# Patient Record
Sex: Male | Born: 1950 | Race: White | Hispanic: No | Marital: Married | Smoking: Never smoker
Health system: Southern US, Community
[De-identification: ages and names within clinical notes are randomized; demographics above are authoritative.]

## PROBLEM LIST (undated history)

## (undated) DIAGNOSIS — R739 Hyperglycemia, unspecified: Secondary | ICD-10-CM

## (undated) DIAGNOSIS — E785 Hyperlipidemia, unspecified: Secondary | ICD-10-CM

## (undated) DIAGNOSIS — I10 Essential (primary) hypertension: Secondary | ICD-10-CM

## (undated) HISTORY — DX: Essential (primary) hypertension: I10

## (undated) HISTORY — PX: HERNIA REPAIR: SHX51

## (undated) HISTORY — DX: Hyperlipidemia, unspecified: E78.5

## (undated) HISTORY — DX: Hyperglycemia, unspecified: R73.9

---

## 2003-12-25 ENCOUNTER — Ambulatory Visit: Payer: Self-pay | Admitting: Family Medicine

## 2004-01-30 ENCOUNTER — Ambulatory Visit: Payer: Self-pay | Admitting: Family Medicine

## 2004-04-18 ENCOUNTER — Ambulatory Visit: Payer: Self-pay | Admitting: Family Medicine

## 2004-11-25 ENCOUNTER — Ambulatory Visit: Payer: Self-pay | Admitting: Family Medicine

## 2005-03-18 ENCOUNTER — Ambulatory Visit: Payer: Self-pay | Admitting: Family Medicine

## 2005-04-03 ENCOUNTER — Ambulatory Visit: Payer: Self-pay | Admitting: Family Medicine

## 2005-06-02 ENCOUNTER — Ambulatory Visit: Payer: Self-pay | Admitting: Family Medicine

## 2006-02-24 ENCOUNTER — Ambulatory Visit: Payer: Self-pay | Admitting: Family Medicine

## 2006-03-10 ENCOUNTER — Ambulatory Visit: Payer: Self-pay | Admitting: Family Medicine

## 2006-07-04 ENCOUNTER — Encounter: Payer: Self-pay | Admitting: Family Medicine

## 2006-07-06 ENCOUNTER — Ambulatory Visit: Payer: Self-pay | Admitting: Family Medicine

## 2006-07-06 DIAGNOSIS — J309 Allergic rhinitis, unspecified: Secondary | ICD-10-CM | POA: Insufficient documentation

## 2006-07-06 DIAGNOSIS — J45990 Exercise induced bronchospasm: Secondary | ICD-10-CM

## 2006-07-06 DIAGNOSIS — E7800 Pure hypercholesterolemia, unspecified: Secondary | ICD-10-CM

## 2006-07-06 DIAGNOSIS — Z87898 Personal history of other specified conditions: Secondary | ICD-10-CM | POA: Insufficient documentation

## 2006-07-06 DIAGNOSIS — K219 Gastro-esophageal reflux disease without esophagitis: Secondary | ICD-10-CM | POA: Insufficient documentation

## 2006-09-11 ENCOUNTER — Telehealth (INDEPENDENT_AMBULATORY_CARE_PROVIDER_SITE_OTHER): Payer: Self-pay | Admitting: *Deleted

## 2006-09-11 ENCOUNTER — Ambulatory Visit: Payer: Self-pay | Admitting: Family Medicine

## 2006-09-22 DIAGNOSIS — H1030 Unspecified acute conjunctivitis, unspecified eye: Secondary | ICD-10-CM | POA: Insufficient documentation

## 2006-09-22 DIAGNOSIS — S6710XA Crushing injury of unspecified finger(s), initial encounter: Secondary | ICD-10-CM | POA: Insufficient documentation

## 2006-09-23 ENCOUNTER — Ambulatory Visit: Payer: Self-pay | Admitting: Internal Medicine

## 2006-09-23 ENCOUNTER — Encounter (INDEPENDENT_AMBULATORY_CARE_PROVIDER_SITE_OTHER): Payer: Self-pay | Admitting: *Deleted

## 2006-09-24 ENCOUNTER — Telehealth (INDEPENDENT_AMBULATORY_CARE_PROVIDER_SITE_OTHER): Payer: Self-pay | Admitting: *Deleted

## 2007-03-19 ENCOUNTER — Ambulatory Visit: Payer: Self-pay | Admitting: Family Medicine

## 2007-03-19 DIAGNOSIS — J069 Acute upper respiratory infection, unspecified: Secondary | ICD-10-CM | POA: Insufficient documentation

## 2007-03-24 ENCOUNTER — Telehealth: Payer: Self-pay | Admitting: Family Medicine

## 2009-09-11 ENCOUNTER — Encounter (INDEPENDENT_AMBULATORY_CARE_PROVIDER_SITE_OTHER): Payer: Self-pay | Admitting: *Deleted

## 2010-03-05 NOTE — Letter (Signed)
Summary: Nadara Eaton letter  [place] [facility]  [address] [place], [place] [address]   Phone: [phone]  Fax: [phone]       09/11/2009 MRN: [id number]  [facility] [place] [address] [place], [place]  [address]  Dear Mr. Dante Gang,  [facility] [place], and [facility] announce the retirement of Arta Silence, M.D., from full-time practice at the [facility] office effective August 02, 2009 and his plans of returning part-time.  It is important to Dr. Hetty Ely and to our practice that you understand that [facility] has seven physicians in our office for your health care needs.  We will continue to offer the same exceptional care that you have today.    Dr. Hetty Ely has spoken to many of you about his plans for retirement and returning part-time in the fall.   We will continue to work with you through the transition to schedule appointments for you in the office and meet the high standards that [facility] is committed to.   Again, it is with great pleasure that we share the news that Dr. Hetty Ely will return to [facility] at [facility] in October of 2011 with a reduced schedule.    If you have any questions, or would like to request an appointment with one of our physicians, please call us at [phone] and press the option for Scheduling an appointment.  We take pleasure in providing you with excellent patient care and look forward to seeing you at your next office visit.  Our [facility] Physicians are:  Tillman Abide, M.D. Laurita Quint, M.D. Roxy Manns, M.D. Kerby Nora, M.D. Hannah Beat, M.D. Ruthe Mannan, M.D. We proudly welcomed Raechel Ache, M.D. and Eustaquio Boyden, M.D. to the practice in July/August 2011.  Sincerely,  [facility]

## 2018-04-07 ENCOUNTER — Other Ambulatory Visit: Payer: Self-pay

## 2018-04-07 DIAGNOSIS — I10 Essential (primary) hypertension: Secondary | ICD-10-CM

## 2018-04-07 MED ORDER — PROPRANOLOL HCL 20 MG PO TABS
10.0000 mg | ORAL_TABLET | Freq: Every day | ORAL | 3 refills | Status: DC
Start: 2018-04-07 — End: 2018-08-11

## 2018-06-29 ENCOUNTER — Other Ambulatory Visit: Payer: Self-pay | Admitting: Cardiology

## 2018-08-11 ENCOUNTER — Other Ambulatory Visit: Payer: Self-pay | Admitting: Cardiology

## 2018-08-11 NOTE — Telephone Encounter (Signed)
Please fill if necessary

## 2018-09-27 ENCOUNTER — Other Ambulatory Visit: Payer: Self-pay

## 2018-09-27 DIAGNOSIS — E7800 Pure hypercholesterolemia, unspecified: Secondary | ICD-10-CM

## 2018-09-27 MED ORDER — OLMESARTAN MEDOXOMIL 20 MG PO TABS: 20.0000 mg | ORAL_TABLET | Freq: Every day | ORAL | 3 refills | Status: DC

## 2018-09-27 MED ORDER — LIVALO 2 MG PO TABS: 1.0000 | ORAL_TABLET | Freq: Every day | ORAL | 3 refills | Status: DC

## 2018-09-30 ENCOUNTER — Other Ambulatory Visit: Payer: Self-pay

## 2018-12-27 ENCOUNTER — Encounter: Payer: Self-pay | Admitting: Cardiology

## 2018-12-27 ENCOUNTER — Other Ambulatory Visit: Payer: Self-pay

## 2018-12-27 ENCOUNTER — Ambulatory Visit: Payer: 59 | Admitting: Cardiology

## 2018-12-27 VITALS — BP 131/85 | HR 67 | Ht 71.5 in | Wt 190.0 lb

## 2018-12-27 DIAGNOSIS — I10 Essential (primary) hypertension: Secondary | ICD-10-CM | POA: Diagnosis not present

## 2018-12-27 DIAGNOSIS — E7800 Pure hypercholesterolemia, unspecified: Secondary | ICD-10-CM

## 2018-12-27 DIAGNOSIS — R9431 Abnormal electrocardiogram [ECG] [EKG]: Secondary | ICD-10-CM | POA: Diagnosis not present

## 2018-12-27 DIAGNOSIS — R739 Hyperglycemia, unspecified: Secondary | ICD-10-CM

## 2018-12-27 DIAGNOSIS — G250 Essential tremor: Secondary | ICD-10-CM

## 2018-12-27 DIAGNOSIS — E782 Mixed hyperlipidemia: Secondary | ICD-10-CM

## 2018-12-27 MED ORDER — PROPRANOLOL HCL 10 MG PO TABS: 10.0000 mg | ORAL_TABLET | Freq: Every day | ORAL | 2 refills | Status: DC | PRN

## 2018-12-27 MED ORDER — LIVALO 2 MG PO TABS: 1.0000 | ORAL_TABLET | Freq: Every day | ORAL | 3 refills | Status: DC

## 2018-12-27 MED ORDER — PROPRANOLOL HCL 10 MG PO TABS: 10.0000 mg | ORAL_TABLET | ORAL | 2 refills | Status: DC | PRN

## 2018-12-27 MED ORDER — OLMESARTAN MEDOXOMIL 20 MG PO TABS: 20.0000 mg | ORAL_TABLET | Freq: Every day | ORAL | 3 refills | Status: DC

## 2018-12-27 NOTE — Progress Notes (Signed)
Primary Physician/Referring:  Rusty Aus, MD  Patient ID: Jesus Hobbs, male    DOB: 1950/10/29, 68 y.o.   MRN: [id number]  Chief Complaint  Patient presents with  . Hypertension    1 year follow up  . Follow-up   HPI:    Jesus Hobbs  is a 68 y.o. Caucasian male patient with  hypertension and hyperlipidemia. Last stress test was in June 2012 was normal with 12.5 METs. Echocardiogram in 2012 was normal with mild LVH and mild left atrial enlargement.  This is his annual visit. He is presently tolerating Livalo and Zetia without any side effects, previously did not tolerate Crestor or Lipitor. He is essentially asymptomatic.   Past Medical History:  Diagnosis Date  . Hyperglycemia   . Hyperlipidemia   . Hypertension    History reviewed. No pertinent surgical history. Social History   Socioeconomic History  . Marital status: Single    Spouse name: Not on file  . Number of children: Not on file  . Years of education: Not on file  . Highest education level: Not on file  Occupational History  . Not on file  Social Needs  . Financial resource strain: Not on file  . Food insecurity    Worry: Not on file    Inability: Not on file  . Transportation needs    Medical: Not on file    Non-medical: Not on file  Tobacco Use  . Smoking status: Never Smoker  . Smokeless tobacco: Never Used  Substance and Sexual Activity  . Alcohol use: Not on file  . Drug use: Not on file  . Sexual activity: Not on file  Lifestyle  . Physical activity    Days per week: Not on file    Minutes per session: Not on file  . Stress: Not on file  Relationships  . Social Herbalist on phone: Not on file    Gets together: Not on file    Attends religious service: Not on file    Active member of club or organization: Not on file    Attends meetings of clubs or organizations: Not on file    Relationship status: Not on file  . Intimate partner violence    Fear of current or  ex partner: Not on file    Emotionally abused: Not on file    Physically abused: Not on file    Forced sexual activity: Not on file  Other Topics Concern  . Not on file  Social History Narrative  . Not on file   ROS  Review of Systems  Constitution: Negative for chills, decreased appetite, malaise/fatigue and weight gain.  Cardiovascular: Negative for dyspnea on exertion, leg swelling and syncope.  Endocrine: Negative for cold intolerance.  Hematologic/Lymphatic: Does not bruise/bleed easily.  Musculoskeletal: Negative for joint swelling.  Gastrointestinal: Negative for abdominal pain, anorexia, change in bowel habit, hematochezia and melena.  Neurological: Negative for headaches and light-headedness.  Psychiatric/Behavioral: Negative for depression and substance abuse.  All other systems reviewed and are negative.  Objective   Vitals with BMI 12/27/2018 03/19/2007 09/23/2006  Height 5' 11.5" - -  Weight 190 lbs 200 lbs 195 lbs  BMI 18.84 - -  Systolic 166 063 016  Diastolic 85 96 87  Pulse 67 64 68    Physical Exam  Constitutional: He appears well-developed and well-nourished.  HENT:  Head: Atraumatic.  Eyes: Conjunctivae are normal.  Neck: Neck supple. No JVD present.  No thyromegaly present.  Cardiovascular: Normal rate, regular rhythm, normal heart sounds and intact distal pulses. Exam reveals no gallop.  No murmur heard. No leg edema, no JVD.  Pulmonary/Chest: Effort normal and breath sounds normal.  Abdominal: Soft. Bowel sounds are normal.  Musculoskeletal: Normal range of motion.  Neurological: He is alert.  Skin: Skin is warm and dry.  Psychiatric: He has a normal mood and affect.   Laboratory examination:   Labs 09/21/2017: HB 15.9/HCT 49.0, normal indicis, serum glucose 127 mg, BUN 19, creatinine 1.1, eGFR greater than 60 well.  A1c 6.4%.  Total cholesterol 167, triglycerides 160, HDL 44, LDL 91.  TSH normal.  Complete Blood Count (CBC) with  DifferentialResulted: 09/28/2018 9:40 AM [place] Component Name Value Ref Range  White Blood Cell Count - [facility] 4.5 3.4 - 10.8 x10E3/uL  Red Blood Cell Count - [facility] 4.89 4.14 - 5.8 x10E6/uL  Hemoglobin - [facility] 15.4 13 - 17.7 g/dL  Hematocrit - [facility] 44.8 37.5 - 51 %  MCV - [facility] 92 79 - 97 fL  MCH- [facility] 31.5 26.6 - 33 pg  MCHC - [facility] 34.4 31.5 - 35.7 g/dL  RDW - [facility] 12.2 11.6 - 15.4 %  Platelet Count - [facility] 175 150 - 450 x10E3/uL  Neutrophils - [facility] 69 Not Estab. %  LYMPHS -[facility] 17 Not Estab. %  Monocytes - [facility] 11 Not Estab. %  Eos - [facility] 3 Not Estab. %  Basos - [facility] 0 Not Estab. %  Neutrophils (Absolute) - [facility] 3.0 1.4 - 7 x10E3/uL  Lymphs (Absolute) - [facility] 0.8 0.7 - 3.1 x10E3/uL  Monocytes(Absolute) - [facility] 0.5 0.1 - 0.9 x10E3/uL  Eos (Absolute) - [facility] 0.1 0 - 0.4 x10E3/uL  Baso (Absolute) - [facility] 0.0 0 - 0.2 x10E3/uL  Immature Granulocytes - [facility] 0 Not Estab. %  Immature Grans (Abs) - [facility]     Comprehensive Metabolic Panel (CMP)Resulted: 09/28/2018 9:40 AM [place] Component Name Value Ref Range  Glucose Random - [facility] 133 (H) 65 - 99 mg/dL  Blood Urea Nitrogen - [facility] 16 8 - 27 mg/dL  Creatinine- [facility] 0.99 0.76 - 1.27 mg/dL  eGFR If NonAfricn Am - [facility] 77 >59 mL/min/1.73  eGFR If Africn Am - [facility] 89 >59 mL/min/1.73  Bun/Creatinine Ratio - [facility] 16 10 - 24   Sodium - [facility] 140 134 - 144 mmol/L  Potassium - [facility] 4.3 3.5 - 5.2 mmol/L  Chloride - [facility] 101 96 - 106 mmol/L  Carbon Dioxide - [facility] 24 20 - 29 mmol/L  Calcium - [facility] 9.7 8.6 - 10.2 mg/dL  Protein Total - [facility] 6.8 6 - 8.5 g/dL  Albumin - [facility] 4.5 3.8 - 4.8 g/dL  Globulin, Total - [facility] 2.3 1.5 - 4.5 g/dL  A/G Ratio - [facility] 2.0 1.2 - 2.2   Bilirubin Total - [facility] 0.8 0 - 1.2 mg/dL  Alkaline Phosphatase - [facility] 53 39 - 117 IU/L  Ast - [facility] 17 0 - 40 IU/L  Alanine  Aminotransferase - [facility] 20 0 - 44 IU/L   Lipid Panel With LDL/HDL Ratio - [facility]Resulted: 09/28/2018 9:40 AM [place] Component Name Value Ref Range  Cholesterol, Total - [facility] 171 100 - 199 mg/dL  Triglycerides - [facility] 145 0 - 149 mg/dL  Hdl Cholesterol - [facility] 48 >39 mg/dL  VLDL Cholesterol Cal - [facility] 29 5 - 40 mg/dL  Low Density Lipoprotein - [facility] 94  Comment:    Hemoglobin  A1CResulted: 09/21/2017 1:14 PM [place] Component Name Value Ref Range  Hemoglobin A1C 6.4 (H) 4.2 - 5.6 %  Average Blood Glucose (Calc) 137 mg/dL   No results for input(s): NA, K, CL, CO2, GLUCOSE, BUN, CREATININE, CALCIUM, GFRNONAA, GFRAA in the last 8760 hours. CrCl cannot be calculated (No successful lab value found.).  No flowsheet data found. No flowsheet data found. Lipid Panel  No results found for: CHOL, TRIG, HDL, CHOLHDL, VLDL, LDLCALC, LDLDIRECT HEMOGLOBIN A1C No results found for: HGBA1C, MPG TSH No results for input(s): TSH in the last 8760 hours. Medications and allergies   Allergies  Allergen Reactions  . Loratadine-Pseudoephedrine Er     REACTION: unspecified  . Mometasone Furoate     REACTION: unspecified  . Prednisone     REACTION: unspecified     Current Outpatient Medications  Medication Instructions  . aspirin 325 mg, Oral, Daily  . Livalo 2 mg, Oral, Daily  . olmesartan (BENICAR) 20 mg, Oral, Daily  . propranolol (INDERAL) 10 mg, Oral, As needed    Radiology:  No results found. Cardiac Studies:   Carotid duplex 11/04/10: Normal carotid duplex. Normal intimal medial thickness  Echo 11/04/10: Normal LVEF. Mild LVH. Mild left atrial enlargement.   Stress EKG 07/08/10 Normal Stress EKG. 12.5 METS.    Assessment     ICD-10-CM   1. Essential hypertension  I10 EKG 12-Lead    olmesartan (BENICAR) 20 MG tablet  2. Mixed hyperlipidemia  E78.2 EKG 12-Lead    Pitavastatin Calcium (LIVALO) 2 MG TABS  3.  Hyperglycemia  R73.9   4. Nonspecific abnormal electrocardiogram (ECG) (EKG)  R94.31   5. HYPERCHOLESTEROLEMIA, MIXED  E78.00 olmesartan (BENICAR) 20 MG tablet  6. Benign essential tremor  G25.0 propranolol (INDERAL) 10 MG tablet    EKG 12/27/2018: Normal sinus rhythm at rate of 66 bpm, normal axis.  Nonspecific T normality, cannot exclude lateral ischemia. No significant change from  EKG 12/28/2017.  Recommendations:   Meds ordered this encounter  Medications  . olmesartan (BENICAR) 20 MG tablet    Sig: Take 1 tablet (20 mg total) by mouth daily.    Dispense:  90 tablet    Refill:  3  . propranolol (INDERAL) 10 MG tablet    Sig: Take 1 tablet (10 mg total) by mouth as needed.    Dispense:  60 tablet    Refill:  2  . Pitavastatin Calcium (LIVALO) 2 MG TABS    Sig: Take 1 tablet (2 mg total) by mouth daily.    Dispense:  90 tablet    Refill:  3    Patient is here on annual visit and follow-up of abnormal EKG, hypertension, hyperlipidemia.  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  I reviewed his labs, he does have hyperglycemia, is aware of this.  I refilled his prescriptions.  He also has essential tremors.  Adrian Prows, MD, [facility] 12/27/2018, 9:31 AM [facility] [address] Pager: [phone] Office: [phone] If no answer Cell [phone]

## 2019-12-26 ENCOUNTER — Encounter: Payer: Self-pay | Admitting: Cardiology

## 2019-12-26 ENCOUNTER — Other Ambulatory Visit: Payer: Self-pay

## 2019-12-26 ENCOUNTER — Ambulatory Visit: Payer: 59 | Admitting: Cardiology

## 2019-12-26 VITALS — BP 112/80 | HR 58 | Resp 16 | Ht 71.0 in | Wt 194.0 lb

## 2019-12-26 DIAGNOSIS — R739 Hyperglycemia, unspecified: Secondary | ICD-10-CM

## 2019-12-26 DIAGNOSIS — I10 Essential (primary) hypertension: Secondary | ICD-10-CM

## 2019-12-26 DIAGNOSIS — E782 Mixed hyperlipidemia: Secondary | ICD-10-CM

## 2019-12-26 MED ORDER — OMEGA-3-ACID ETHYL ESTERS 1 G PO CAPS: 2.0000 g | ORAL_CAPSULE | Freq: Two times a day (BID) | ORAL | 3 refills | Status: DC

## 2019-12-26 NOTE — Progress Notes (Signed)
Primary Physician/Referring:  Rusty Aus, MD  Patient ID: Jesus Hobbs, male    DOB: [birth year], 69 y.o.   MRN: [id number]  Chief Complaint  Patient presents with  . Hypertension  . Abnormal ECG  . Follow-up    1 year   HPI:    Jesus Hobbs  is a 69 y.o. Caucasian male patient with  hypertension and hyperlipidemia. Last stress test was in June 2012 was normal with 12.5 METs. Echocardiogram in 2012 was normal with mild LVH and mild left atrial enlargement.  This is his annual visit. He is presently tolerating Livalo without any side effects, previously did not tolerate Crestor or Lipitor. He is essentially asymptomatic.   Past Medical History:  Diagnosis Date  . Hyperglycemia   . Hyperlipidemia   . Hypertension    History reviewed. No pertinent surgical history.   Social History   Tobacco Use  . Smoking status: Never Smoker  . Smokeless tobacco: Never Used  Substance Use Topics  . Alcohol use: Never  Marital Status: Married    ROS  Review of Systems  Cardiovascular: Negative for chest pain, dyspnea on exertion and leg swelling.  Gastrointestinal: Negative for melena.  Neurological: Positive for tremors.   Objective   Vitals with BMI 12/26/2019 12/27/2018 03/19/2007  Height 5' 11" 5' 11.5" -  Weight 194 lbs 190 lbs 200 lbs  BMI 07.68 08.81 -  Systolic 103 159 458  Diastolic 80 85 96  Pulse 58 67 64    Physical Exam Constitutional:      Appearance: He is well-developed.  HENT:     Head: Atraumatic.  Eyes:     Conjunctiva/sclera: Conjunctivae normal.  Neck:     Thyroid: No thyromegaly.     Vascular: No JVD.  Cardiovascular:     Rate and Rhythm: Normal rate and regular rhythm.     Pulses: Intact distal pulses.     Heart sounds: Normal heart sounds. No murmur heard.  No gallop.      Comments: No leg edema, no JVD. Pulmonary:     Effort: Pulmonary effort is normal.     Breath sounds: Normal breath sounds.  Abdominal:     General: Bowel  sounds are normal.     Palpations: Abdomen is soft.  Musculoskeletal:        General: Normal range of motion.     Cervical back: Neck supple.  Skin:    General: Skin is warm and dry.  Neurological:     Mental Status: He is alert.    Laboratory examination:   Labs 10/04/2019:  Hb 15.1/HCT 44.7, platelets 186, normal indicis.  Serum glucose 126, BUN 10, creatinine 0.95, EGFR >60 mL.  Total cholesterol 164, triglyceride 200, HDL 43, LDL 87.  A1c 6.6%.  TSH normal.  Vitamin D normal at 36.9.  Labs 09/21/2017: HB 15.9/HCT 49.0, normal indicis, serum glucose 127 mg, BUN 19, creatinine 1.1, eGFR greater than 60 well.  A1c 6.4%.  Total cholesterol 167, triglycerides 160, HDL 44, LDL 91.  TSH normal.  Medications and allergies   Allergies  Allergen Reactions  . Loratadine-Pseudoephedrine Er     REACTION: unspecified  . Mometasone Furoate     REACTION: unspecified  . Prednisone     REACTION: unspecified    Current Outpatient Medications on File Prior to Visit  Medication Sig Dispense Refill  . Cholecalciferol 25 MCG (1000 UT) capsule Take by mouth.    . cyanocobalamin 1000 MCG tablet Take by  mouth.    . olmesartan (BENICAR) 20 MG tablet Take 1 tablet (20 mg total) by mouth daily. 90 tablet 3  . Pitavastatin Calcium (LIVALO) 2 MG TABS Take 1 tablet (2 mg total) by mouth daily. 90 tablet 3  . propranolol (INDERAL) 10 MG tablet Take 1 tablet (10 mg total) by mouth daily as needed. 60 tablet 2   No current facility-administered medications on file prior to visit.    Radiology:  No results found. Cardiac Studies:   Carotid duplex 11/04/10: Normal carotid duplex. Normal intimal medial thickness  Echo 11/04/10: Normal LVEF. Mild LVH. Mild left atrial enlargement.   Stress EKG 07/08/10 Normal Stress EKG. 12.5 METS.   EKG:  EKG 12/26/2019: Sinus bradycardia at rate of 53 bpm, normal axis, nonspecific T abnormality.  No significant change from 12/27/2018.  Assessment      ICD-10-CM   1. Essential hypertension  I10 EKG 12-Lead  2. Mixed hyperlipidemia  E78.2 omega-3 acid ethyl esters (LOVAZA) 1 g capsule  3. Hyperglycemia  R73.9     Meds ordered this encounter  Medications  . omega-3 acid ethyl esters (LOVAZA) 1 g capsule    Sig: Take 2 capsules (2 g total) by mouth 2 (two) times daily.    Dispense:  360 capsule    Refill:  3   Medications Discontinued During This Encounter  Medication Reason  . aspirin 325 MG EC tablet Discontinued by provider  . Omega-3 Fatty Acids (FISH OIL) 1000 MG CAPS Change in therapy     Recommendations:    Jesus Hobbs  is a 69 y.o. Caucasian male patient with benign essential tremors and takes propranolol on a as needed basis, hypertension and hyperlipidemia. Last stress test was in June 2012 was normal with 12.5 METs. Echocardiogram in 2012 was normal with mild LVH and mild left atrial enlargement.  Patient is here on annual visit and follow-up of abnormal EKG, hypertension, hyperlipidemia.  He is presently doing well and remains asymptomatic. I reviewed his labs, he does have hyperglycemia and in diabetic range.  His triglycerides are also uncontrolled.  I have added Lovaza 2 g twice daily and discontinue OTC fish oil supplement.  He will continue to follow-up with his PCP.  Blood pressure is well controlled.  Examination is unchanged and he remains asymptomatic.  I will see him back in 1 year for follow-up.   Adrian Prows, MD, [facility] 12/26/2019, 9:46 AM Office: [phone] Pager: [phone]

## 2020-01-15 ENCOUNTER — Other Ambulatory Visit: Payer: Self-pay | Admitting: Cardiology

## 2020-01-15 DIAGNOSIS — E782 Mixed hyperlipidemia: Secondary | ICD-10-CM

## 2020-11-21 ENCOUNTER — Other Ambulatory Visit: Payer: Self-pay | Admitting: Orthopedic Surgery

## 2020-11-29 ENCOUNTER — Encounter
Admission: RE | Admit: 2020-11-29 | Discharge: 2020-11-29 | Disposition: A | Discharge status: Home / Self Care | Payer: 59 | Source: Ambulatory Visit | Attending: Orthopedic Surgery | Admitting: Orthopedic Surgery

## 2020-11-29 ENCOUNTER — Other Ambulatory Visit: Payer: Self-pay

## 2020-11-29 VITALS — BP 146/79 | HR 80 | Temp 98.6°F | Resp 18 | Ht 72.0 in | Wt 187.3 lb

## 2020-11-29 DIAGNOSIS — Z01818 Encounter for other preprocedural examination: Secondary | ICD-10-CM | POA: Insufficient documentation

## 2020-11-29 DIAGNOSIS — Z01812 Encounter for preprocedural laboratory examination: Secondary | ICD-10-CM

## 2020-11-29 DIAGNOSIS — Z01810 Encounter for preprocedural cardiovascular examination: Secondary | ICD-10-CM | POA: Diagnosis not present

## 2020-11-29 LAB — CBC WITH DIFFERENTIAL/PLATELET
Abs Immature Granulocytes: 0.02 10*3/uL (ref 0.00–0.07)
Basophils Absolute: 0 10*3/uL (ref 0.0–0.1)
Basophils Relative: 1 %
Eosinophils Absolute: 0.1 10*3/uL (ref 0.0–0.5)
Eosinophils Relative: 2 %
HCT: 42.1 % (ref 39.0–52.0)
Hemoglobin: 14 g/dL (ref 13.0–17.0)
Immature Granulocytes: 1 %
Lymphocytes Relative: 23 %
Lymphs Abs: 0.9 10*3/uL (ref 0.7–4.0)
MCH: 30.8 pg (ref 26.0–34.0)
MCHC: 33.3 g/dL (ref 30.0–36.0)
MCV: 92.7 fL (ref 80.0–100.0)
Monocytes Absolute: 0.4 10*3/uL (ref 0.1–1.0)
Monocytes Relative: 9 %
Neutro Abs: 2.6 10*3/uL (ref 1.7–7.7)
Neutrophils Relative %: 64 %
Platelets: 167 10*3/uL (ref 150–400)
RBC: 4.54 MIL/uL (ref 4.22–5.81)
RDW: 11.9 % (ref 11.5–15.5)
WBC: 4 10*3/uL (ref 4.0–10.5)
nRBC: 0 % (ref 0.0–0.2)

## 2020-11-29 LAB — URINALYSIS, ROUTINE W REFLEX MICROSCOPIC
Bilirubin Urine: NEGATIVE
Glucose, UA: NEGATIVE mg/dL
Hgb urine dipstick: NEGATIVE
Ketones, ur: NEGATIVE mg/dL
Leukocytes,Ua: NEGATIVE
Nitrite: NEGATIVE
Protein, ur: NEGATIVE mg/dL
Specific Gravity, Urine: 1.006 (ref 1.005–1.030)
pH: 5 (ref 5.0–8.0)

## 2020-11-29 LAB — COMPREHENSIVE METABOLIC PANEL
ALT: 19 U/L (ref 0–44)
AST: 19 U/L (ref 15–41)
Albumin: 4.1 g/dL (ref 3.5–5.0)
Alkaline Phosphatase: 48 U/L (ref 38–126)
Anion gap: 8 (ref 5–15)
BUN: 16 mg/dL (ref 8–23)
CO2: 28 mmol/L (ref 22–32)
Calcium: 9.1 mg/dL (ref 8.9–10.3)
Chloride: 102 mmol/L (ref 98–111)
Creatinine, Ser: 0.96 mg/dL (ref 0.61–1.24)
GFR, Estimated: >60 mL/min (ref >60)
Glucose, Bld: 143 mg/dL — ABNORMAL HIGH (ref 70–99)
Potassium: 3.9 mmol/L (ref 3.5–5.1)
Sodium: 138 mmol/L (ref 135–145)
Total Bilirubin: 1 mg/dL (ref 0.3–1.2)
Total Protein: 6.8 g/dL (ref 6.5–8.1)

## 2020-11-29 LAB — TYPE AND SCREEN
ABO/RH(D): O POS
Antibody Screen: NEGATIVE

## 2020-11-29 LAB — SURGICAL PCR SCREEN
MRSA, PCR: NEGATIVE
Staphylococcus aureus: NEGATIVE

## 2020-11-29 NOTE — Patient Instructions (Addendum)
Your procedure is scheduled on: Thursday 12/13/20 Report to the Registration Desk on the 1st floor of the Medical Mall. To find out your arrival time, please call [phone] between 1PM - 3PM on: Wednesday 12/12/20  REMEMBER: Instructions that are not followed completely may result in serious medical risk, up to and including death; or upon the discretion of your surgeon and anesthesiologist your surgery may need to be rescheduled.  Do not eat food after midnight the night before surgery.  No gum chewing, lozengers or hard candies.  You may however, drink CLEAR liquids up to 2 hours before you are scheduled to arrive for your surgery. Do not drink anything within 2 hours of your scheduled arrival time.  Clear liquids include: - water  - apple juice without pulp - gatorade (not RED, PURPLE, OR BLUE) - black coffee or tea (Do NOT add milk or creamers to the coffee or tea) Do NOT drink anything that is not on this list.  In addition, your doctor has ordered for you to drink the provided  Ensure Pre-Surgery Clear Carbohydrate Drink  Drinking this carbohydrate drink up to two hours before surgery helps to reduce insulin resistance and improve patient outcomes. Please complete drinking 2 hours prior to scheduled arrival time.  TAKE THESE MEDICATIONS THE MORNING OF SURGERY WITH A SIP OF WATER: propranolol (INDERAL) 10 MG tablet traMADol (ULTRAM) 50 MG tablet  One week prior to surgery: Stop Anti-inflammatories (NSAIDS) such as Advil, Aleve, Ibuprofen, Motrin, Naproxen, Naprosyn and Aspirin based products such as Excedrin, Goodys Powder, BC Powder. Stop Cholecalciferol 25 MCG (1000 UT) capsule, Cyanocobalamin 1500 MCG TBDP, Misc Natural Products (APPLE CIDER VINEGAR DIET PO), zinc gluconate 50 MG tablet and ANY OVER THE COUNTER supplements until after surgery. You may however, continue to take Tylenol if needed for pain up until the day of surgery.  No Alcohol for 24 hours before or after  surgery.  No Smoking including e-cigarettes for 24 hours prior to surgery.  No chewable tobacco products for at least 6 hours prior to surgery.  No nicotine patches on the day of surgery.  Do not use any "recreational" drugs for at least a week prior to your surgery.  Please be advised that the combination of cocaine and anesthesia may have negative outcomes, up to and including death. If you test positive for cocaine, your surgery will be cancelled.  On the morning of surgery brush your teeth with toothpaste and water, you may rinse your mouth with mouthwash if you wish. Do not swallow any toothpaste or mouthwash.  Use CHG Soap as directed on instruction sheet.  Do not wear jewelry.  Do not wear lotions, powders, or colognes.   Do not shave body from the neck down 48 hours prior to surgery just in case you cut yourself which could leave a site for infection.  Also, freshly shaved skin may become irritated if using the CHG soap.  Do not bring valuables to the hospital. [facility] is not responsible for any missing/lost belongings or valuables.   Notify your doctor if there is any change in your medical condition (cold, fever, infection).  Wear comfortable clothing (specific to your surgery type) to the hospital.  After surgery, you can help prevent lung complications by doing breathing exercises.  Take deep breaths and cough every 1-2 hours. Your doctor may order a device called an Incentive Spirometer to help you take deep breaths.  If you are being admitted to the hospital overnight, leave your  suitcase in the car. After surgery it may be brought to your room.  If you are taking public transportation, you will need to have a responsible adult (18 years or older) with you. Please confirm with your physician that it is acceptable to use public transportation.   Please call the Pre-admissions Testing Dept. at [phone] if you have any questions about these  instructions.  Surgery Visitation Policy:  Patients undergoing a surgery or procedure may have one family member or support person with them as long as that person is not COVID-19 positive or experiencing its symptoms.  That person may remain in the waiting area during the procedure and may rotate out with other people.  Inpatient Visitation:    Visiting hours are 7 a.m. to 8 p.m. Up to two visitors ages 16+ are allowed at one time in a patient room. The visitors may rotate out with other people during the day. Visitors must check out when they leave, or other visitors will not be allowed. One designated support person may remain overnight. The visitor must pass COVID-19 screenings, use hand sanitizer when entering and exiting the patient's room and wear a mask at all times, including in the patient's room. Patients must also wear a mask when staff or their visitor are in the room. Masking is required regardless of vaccination status.

## 2020-11-30 ENCOUNTER — Other Ambulatory Visit: Payer: Self-pay

## 2020-12-11 ENCOUNTER — Other Ambulatory Visit: Payer: Self-pay

## 2020-12-11 ENCOUNTER — Other Ambulatory Visit
Admission: RE | Admit: 2020-12-11 | Discharge: 2020-12-11 | Disposition: A | Discharge status: Home / Self Care | Payer: 59 | Source: Ambulatory Visit | Attending: Orthopedic Surgery | Admitting: Orthopedic Surgery

## 2020-12-11 DIAGNOSIS — Z01812 Encounter for preprocedural laboratory examination: Secondary | ICD-10-CM | POA: Diagnosis not present

## 2020-12-11 DIAGNOSIS — Z20822 Contact with and (suspected) exposure to covid-19: Secondary | ICD-10-CM | POA: Diagnosis not present

## 2020-12-12 LAB — SARS CORONAVIRUS 2 (TAT 6-24 HRS): SARS Coronavirus 2: NEGATIVE

## 2020-12-13 ENCOUNTER — Ambulatory Visit: Payer: 59 | Admitting: Urgent Care

## 2020-12-13 ENCOUNTER — Ambulatory Visit: Payer: 59

## 2020-12-13 ENCOUNTER — Encounter
Admission: RE | Disposition: A | Discharge status: Home / Self Care | Payer: Self-pay | Source: Home / Self Care | Attending: Orthopedic Surgery

## 2020-12-13 ENCOUNTER — Ambulatory Visit
Admission: RE | Admit: 2020-12-13 | Discharge: 2020-12-13 | Disposition: A | Discharge status: Home / Self Care | Payer: 59 | Attending: Orthopedic Surgery | Admitting: Orthopedic Surgery

## 2020-12-13 ENCOUNTER — Ambulatory Visit: Payer: 59 | Admitting: Certified Registered"

## 2020-12-13 ENCOUNTER — Other Ambulatory Visit: Payer: Self-pay

## 2020-12-13 ENCOUNTER — Encounter: Payer: Self-pay | Admitting: Orthopedic Surgery

## 2020-12-13 DIAGNOSIS — Z419 Encounter for procedure for purposes other than remedying health state, unspecified: Secondary | ICD-10-CM

## 2020-12-13 DIAGNOSIS — Z01812 Encounter for preprocedural laboratory examination: Secondary | ICD-10-CM

## 2020-12-13 DIAGNOSIS — G8918 Other acute postprocedural pain: Secondary | ICD-10-CM

## 2020-12-13 DIAGNOSIS — M1612 Unilateral primary osteoarthritis, left hip: Secondary | ICD-10-CM | POA: Diagnosis present

## 2020-12-13 DIAGNOSIS — M25572 Pain in left ankle and joints of left foot: Secondary | ICD-10-CM

## 2020-12-13 HISTORY — PX: TOTAL HIP ARTHROPLASTY: SHX124

## 2020-12-13 LAB — ABO/RH: ABO/RH(D): O POS

## 2020-12-13 SURGERY — ARTHROPLASTY, HIP, TOTAL, ANTERIOR APPROACH
Anesthesia: General | Site: Hip | Laterality: Left

## 2020-12-13 MED ORDER — METHOCARBAMOL 500 MG PO TABS
ORAL_TABLET | ORAL | Status: AC
  Administered 2020-12-13: 500 mg via ORAL
  Filled 2020-12-13: qty 1

## 2020-12-13 MED ORDER — TRAMADOL HCL 50 MG PO TABS
ORAL_TABLET | ORAL | Status: AC
  Filled 2020-12-13: qty 1

## 2020-12-13 MED ORDER — NEOMYCIN-POLYMYXIN B GU 40-200000 IR SOLN
Status: DC | PRN
  Administered 2020-12-13: 4 mL

## 2020-12-13 MED ORDER — OXYCODONE HCL 5 MG PO TABS
ORAL_TABLET | ORAL | Status: AC
  Administered 2020-12-13: 5 mg via ORAL
  Filled 2020-12-13: qty 1

## 2020-12-13 MED ORDER — MIDAZOLAM HCL 5 MG/5ML IJ SOLN
INTRAMUSCULAR | Status: DC | PRN
  Administered 2020-12-13: 2 mg via INTRAVENOUS

## 2020-12-13 MED ORDER — MENTHOL 3 MG MT LOZG
1.0000 | LOZENGE | OROMUCOSAL | Status: DC | PRN
  Filled 2020-12-13: qty 9

## 2020-12-13 MED ORDER — CEFAZOLIN SODIUM-DEXTROSE 2-4 GM/100ML-% IV SOLN: 2.0000 g | Freq: Three times a day (TID) | INTRAVENOUS | Status: DC

## 2020-12-13 MED ORDER — PHENOL 1.4 % MT LIQD
1.0000 | OROMUCOSAL | Status: DC | PRN
  Filled 2020-12-13: qty 177

## 2020-12-13 MED ORDER — LACTATED RINGERS IV SOLN: INTRAVENOUS | Status: DC

## 2020-12-13 MED ORDER — SODIUM CHLORIDE 0.9 % IV SOLN: INTRAVENOUS | Status: DC

## 2020-12-13 MED ORDER — HYDROCODONE-ACETAMINOPHEN 5-325 MG PO TABS: 1.0000 | ORAL_TABLET | ORAL | Status: DC | PRN

## 2020-12-13 MED ORDER — TRAMADOL HCL 50 MG PO TABS
50.0000 mg | ORAL_TABLET | Freq: Four times a day (QID) | ORAL | Status: DC
  Administered 2020-12-13: 50 mg via ORAL

## 2020-12-13 MED ORDER — HYDROCODONE-ACETAMINOPHEN 7.5-325 MG PO TABS
1.0000 | ORAL_TABLET | ORAL | Status: DC | PRN
  Filled 2020-12-13: qty 2

## 2020-12-13 MED ORDER — HYDROMORPHONE HCL 1 MG/ML IJ SOLN
INTRAMUSCULAR | Status: AC
  Administered 2020-12-13: 0.5 mg via INTRAVENOUS
  Filled 2020-12-13: qty 1

## 2020-12-13 MED ORDER — BUPIVACAINE HCL (PF) 0.25 % IJ SOLN
INTRAMUSCULAR | Status: AC
  Filled 2020-12-13: qty 30

## 2020-12-13 MED ORDER — ACETAMINOPHEN 10 MG/ML IV SOLN
INTRAVENOUS | Status: AC
  Filled 2020-12-13: qty 100

## 2020-12-13 MED ORDER — LACTATED RINGERS IV BOLUS
500.0000 mL | Freq: Once | INTRAVENOUS | Status: AC
  Administered 2020-12-13: 500 mL via INTRAVENOUS

## 2020-12-13 MED ORDER — ROCURONIUM BROMIDE 10 MG/ML (PF) SYRINGE
PREFILLED_SYRINGE | INTRAVENOUS | Status: AC
  Filled 2020-12-13: qty 10

## 2020-12-13 MED ORDER — ENOXAPARIN SODIUM 40 MG/0.4ML IJ SOSY: 40.0000 mg | PREFILLED_SYRINGE | INTRAMUSCULAR | Status: DC

## 2020-12-13 MED ORDER — LABETALOL HCL 5 MG/ML IV SOLN
5.0000 mg | INTRAVENOUS | Status: DC | PRN
  Administered 2020-12-13: 5 mg via INTRAVENOUS

## 2020-12-13 MED ORDER — HYDROMORPHONE HCL 1 MG/ML IJ SOLN
INTRAMUSCULAR | Status: DC | PRN
  Administered 2020-12-13 (×2): .5 mg via INTRAVENOUS

## 2020-12-13 MED ORDER — METOCLOPRAMIDE HCL 5 MG/ML IJ SOLN: 5.0000 mg | Freq: Three times a day (TID) | INTRAMUSCULAR | Status: DC | PRN

## 2020-12-13 MED ORDER — LIDOCAINE HCL (CARDIAC) PF 100 MG/5ML IV SOSY
PREFILLED_SYRINGE | INTRAVENOUS | Status: DC | PRN
  Administered 2020-12-13: 60 mg via INTRAVENOUS

## 2020-12-13 MED ORDER — CHOLECALCIFEROL 25 MCG (1000 UT) PO CAPS: 4000.0000 [IU] | ORAL_CAPSULE | Freq: Every day | ORAL | Status: DC

## 2020-12-13 MED ORDER — DEXAMETHASONE SODIUM PHOSPHATE 10 MG/ML IJ SOLN
INTRAMUSCULAR | Status: AC
  Filled 2020-12-13: qty 1

## 2020-12-13 MED ORDER — PROPOFOL 10 MG/ML IV BOLUS
INTRAVENOUS | Status: DC | PRN
  Administered 2020-12-13: 120 mg via INTRAVENOUS

## 2020-12-13 MED ORDER — KETAMINE HCL 50 MG/5ML IJ SOSY
PREFILLED_SYRINGE | INTRAMUSCULAR | Status: AC
  Filled 2020-12-13: qty 5

## 2020-12-13 MED ORDER — PRONTOSAN WOUND IRRIGATION OPTIME
TOPICAL | Status: DC | PRN
  Administered 2020-12-13: 1

## 2020-12-13 MED ORDER — CHLORHEXIDINE GLUCONATE 0.12 % MT SOLN
OROMUCOSAL | Status: AC
  Administered 2020-12-13: 15 mL via OROMUCOSAL
  Filled 2020-12-13: qty 15

## 2020-12-13 MED ORDER — PROPRANOLOL HCL 10 MG PO TABS: 10.0000 mg | ORAL_TABLET | Freq: Every day | ORAL | Status: DC | PRN

## 2020-12-13 MED ORDER — ENOXAPARIN SODIUM 40 MG/0.4ML IJ SOSY: 40.0000 mg | PREFILLED_SYRINGE | INTRAMUSCULAR | 0 refills | Status: DC

## 2020-12-13 MED ORDER — POLYETHYLENE GLYCOL 3350 17 G PO PACK
17.0000 g | PACK | Freq: Every day | ORAL | 0 refills | Status: DC
Start: 2020-12-13 — End: 2021-09-03

## 2020-12-13 MED ORDER — PHENYLEPHRINE HCL (PRESSORS) 10 MG/ML IV SOLN
INTRAVENOUS | Status: DC | PRN
  Administered 2020-12-13: 100 ug via INTRAVENOUS

## 2020-12-13 MED ORDER — PRAVASTATIN SODIUM 40 MG PO TABS
40.0000 mg | ORAL_TABLET | Freq: Every day | ORAL | Status: DC
Start: 2020-12-13 — End: 2020-12-13

## 2020-12-13 MED ORDER — ONDANSETRON HCL 4 MG/2ML IJ SOLN: 4.0000 mg | Freq: Four times a day (QID) | INTRAMUSCULAR | Status: DC | PRN

## 2020-12-13 MED ORDER — HYDROMORPHONE HCL 1 MG/ML IJ SOLN
0.5000 mg | INTRAMUSCULAR | Status: DC | PRN
  Administered 2020-12-13: 0.5 mg via INTRAVENOUS

## 2020-12-13 MED ORDER — 0.9 % SODIUM CHLORIDE (POUR BTL) OPTIME
TOPICAL | Status: DC | PRN
  Administered 2020-12-13: 1000 mL

## 2020-12-13 MED ORDER — CEFAZOLIN SODIUM-DEXTROSE 2-4 GM/100ML-% IV SOLN
2.0000 g | INTRAVENOUS | Status: AC
  Administered 2020-12-13: 2 g via INTRAVENOUS

## 2020-12-13 MED ORDER — CEFAZOLIN SODIUM-DEXTROSE 2-4 GM/100ML-% IV SOLN
INTRAVENOUS | Status: AC
  Filled 2020-12-13: qty 100

## 2020-12-13 MED ORDER — HYDROCODONE-ACETAMINOPHEN 7.5-325 MG PO TABS: 1.0000 | ORAL_TABLET | ORAL | 0 refills | Status: DC | PRN

## 2020-12-13 MED ORDER — ONDANSETRON HCL 4 MG PO TABS: 4.0000 mg | ORAL_TABLET | Freq: Four times a day (QID) | ORAL | Status: DC | PRN

## 2020-12-13 MED ORDER — DOCUSATE SODIUM 100 MG PO CAPS: 100.0000 mg | ORAL_CAPSULE | Freq: Two times a day (BID) | ORAL | Status: DC

## 2020-12-13 MED ORDER — METHOCARBAMOL 1000 MG/10ML IJ SOLN
500.0000 mg | Freq: Four times a day (QID) | INTRAVENOUS | Status: DC | PRN
  Filled 2020-12-13: qty 5

## 2020-12-13 MED ORDER — CYANOCOBALAMIN 1500 MCG PO TBDP: 1500.0000 ug | ORAL_TABLET | Freq: Every day | ORAL | Status: DC

## 2020-12-13 MED ORDER — SODIUM CHLORIDE FLUSH 0.9 % IV SOLN
INTRAVENOUS | Status: AC
  Filled 2020-12-13: qty 40

## 2020-12-13 MED ORDER — HYDROMORPHONE HCL 1 MG/ML IJ SOLN
INTRAMUSCULAR | Status: AC
  Filled 2020-12-13: qty 1

## 2020-12-13 MED ORDER — BUPIVACAINE LIPOSOME 1.3 % IJ SUSP
INTRAMUSCULAR | Status: AC
  Filled 2020-12-13: qty 20

## 2020-12-13 MED ORDER — ONDANSETRON HCL 4 MG/2ML IJ SOLN
INTRAMUSCULAR | Status: DC | PRN
  Administered 2020-12-13: 4 mg via INTRAVENOUS

## 2020-12-13 MED ORDER — ONDANSETRON HCL 4 MG/2ML IJ SOLN: 4.0000 mg | Freq: Once | INTRAMUSCULAR | Status: DC | PRN

## 2020-12-13 MED ORDER — MIDAZOLAM HCL 2 MG/2ML IJ SOLN
INTRAMUSCULAR | Status: AC
  Filled 2020-12-13: qty 2

## 2020-12-13 MED ORDER — PROPOFOL 10 MG/ML IV BOLUS
INTRAVENOUS | Status: AC
  Filled 2020-12-13: qty 20

## 2020-12-13 MED ORDER — FAMOTIDINE 20 MG PO TABS: 20.0000 mg | ORAL_TABLET | Freq: Once | ORAL | Status: AC

## 2020-12-13 MED ORDER — LABETALOL HCL 5 MG/ML IV SOLN
INTRAVENOUS | Status: AC
  Filled 2020-12-13: qty 4

## 2020-12-13 MED ORDER — MORPHINE SULFATE (PF) 2 MG/ML IV SOLN: 0.5000 mg | INTRAVENOUS | Status: DC | PRN

## 2020-12-13 MED ORDER — BUPIVACAINE HCL (PF) 0.5 % IJ SOLN
INTRAMUSCULAR | Status: AC
  Filled 2020-12-13: qty 10

## 2020-12-13 MED ORDER — HYDROCODONE-ACETAMINOPHEN 7.5-325 MG PO TABS
ORAL_TABLET | ORAL | Status: AC
  Filled 2020-12-13: qty 2

## 2020-12-13 MED ORDER — SUGAMMADEX SODIUM 200 MG/2ML IV SOLN
INTRAVENOUS | Status: DC | PRN
  Administered 2020-12-13: 200 mg via INTRAVENOUS

## 2020-12-13 MED ORDER — FENTANYL CITRATE (PF) 100 MCG/2ML IJ SOLN
25.0000 ug | INTRAMUSCULAR | Status: DC | PRN
  Administered 2020-12-13 (×3): 25 ug via INTRAVENOUS

## 2020-12-13 MED ORDER — HEMOSTATIC AGENTS (NO CHARGE) OPTIME
TOPICAL | Status: DC | PRN
  Administered 2020-12-13: 2 via TOPICAL

## 2020-12-13 MED ORDER — CEFAZOLIN SODIUM-DEXTROSE 2-4 GM/100ML-% IV SOLN
INTRAVENOUS | Status: AC
  Administered 2020-12-13: 2 g via INTRAVENOUS
  Filled 2020-12-13: qty 100

## 2020-12-13 MED ORDER — METHOCARBAMOL 500 MG PO TABS: 500.0000 mg | ORAL_TABLET | Freq: Four times a day (QID) | ORAL | Status: DC | PRN

## 2020-12-13 MED ORDER — DOCUSATE SODIUM 100 MG PO CAPS: 100.0000 mg | ORAL_CAPSULE | Freq: Two times a day (BID) | ORAL | 0 refills | Status: DC

## 2020-12-13 MED ORDER — ACETAMINOPHEN 10 MG/ML IV SOLN
INTRAVENOUS | Status: DC | PRN
  Administered 2020-12-13: 1000 mg via INTRAVENOUS

## 2020-12-13 MED ORDER — ORAL CARE MOUTH RINSE: 15.0000 mL | Freq: Once | OROMUCOSAL | Status: AC

## 2020-12-13 MED ORDER — OXYCODONE HCL 5 MG PO TABS: 5.0000 mg | ORAL_TABLET | ORAL | Status: AC

## 2020-12-13 MED ORDER — IRBESARTAN 150 MG PO TABS
150.0000 mg | ORAL_TABLET | Freq: Every day | ORAL | Status: DC
Start: 2020-12-13 — End: 2020-12-13

## 2020-12-13 MED ORDER — METHOCARBAMOL 500 MG PO TABS: 500.0000 mg | ORAL_TABLET | Freq: Four times a day (QID) | ORAL | 0 refills | Status: DC | PRN

## 2020-12-13 MED ORDER — TRAMADOL HCL 50 MG PO TABS: 50.0000 mg | ORAL_TABLET | Freq: Four times a day (QID) | ORAL | 0 refills | Status: DC

## 2020-12-13 MED ORDER — NEOMYCIN-POLYMYXIN B GU 40-200000 IR SOLN
Status: AC
  Filled 2020-12-13: qty 4

## 2020-12-13 MED ORDER — KETAMINE HCL 10 MG/ML IJ SOLN
INTRAMUSCULAR | Status: DC | PRN
  Administered 2020-12-13: 20 mg via INTRAVENOUS
  Administered 2020-12-13: 30 mg via INTRAVENOUS

## 2020-12-13 MED ORDER — LACTATED RINGERS IV BOLUS
250.0000 mL | Freq: Once | INTRAVENOUS | Status: AC
  Administered 2020-12-13: 250 mL via INTRAVENOUS

## 2020-12-13 MED ORDER — ROCURONIUM BROMIDE 100 MG/10ML IV SOLN
INTRAVENOUS | Status: DC | PRN
  Administered 2020-12-13: 50 mg via INTRAVENOUS

## 2020-12-13 MED ORDER — FENTANYL CITRATE (PF) 100 MCG/2ML IJ SOLN
INTRAMUSCULAR | Status: DC | PRN
  Administered 2020-12-13 (×2): 50 ug via INTRAVENOUS

## 2020-12-13 MED ORDER — METOCLOPRAMIDE HCL 10 MG PO TABS: 5.0000 mg | ORAL_TABLET | Freq: Three times a day (TID) | ORAL | Status: DC | PRN

## 2020-12-13 MED ORDER — CHLORHEXIDINE GLUCONATE 0.12 % MT SOLN: 15.0000 mL | Freq: Once | OROMUCOSAL | Status: AC

## 2020-12-13 MED ORDER — FENTANYL CITRATE (PF) 100 MCG/2ML IJ SOLN
INTRAMUSCULAR | Status: AC
  Administered 2020-12-13: 25 ug via INTRAVENOUS
  Filled 2020-12-13: qty 2

## 2020-12-13 MED ORDER — FAMOTIDINE 20 MG PO TABS
ORAL_TABLET | ORAL | Status: AC
  Administered 2020-12-13: 20 mg via ORAL
  Filled 2020-12-13: qty 1

## 2020-12-13 MED ORDER — ACETAMINOPHEN 325 MG PO TABS: 325.0000 mg | ORAL_TABLET | Freq: Four times a day (QID) | ORAL | Status: DC | PRN

## 2020-12-13 MED ORDER — SODIUM CHLORIDE (PF) 0.9 % IJ SOLN
INTRAMUSCULAR | Status: DC | PRN
  Administered 2020-12-13: 90 mL via INTRAMUSCULAR

## 2020-12-13 MED ORDER — ONDANSETRON HCL 4 MG/2ML IJ SOLN
INTRAMUSCULAR | Status: AC
  Filled 2020-12-13: qty 2

## 2020-12-13 MED ORDER — FENTANYL CITRATE (PF) 100 MCG/2ML IJ SOLN
INTRAMUSCULAR | Status: AC
  Filled 2020-12-13: qty 2

## 2020-12-13 SURGICAL SUPPLY — 64 items
APL PRP STRL LF DISP 70% ISPRP (MISCELLANEOUS) ×1
BLADE SAGITTAL AGGR TOOTH XLG (BLADE) ×2 IMPLANT
BNDG COHESIVE 6X5 TAN ST LF (GAUZE/BANDAGES/DRESSINGS) ×6 IMPLANT
CANISTER WOUND CARE 500ML ATS (WOUND CARE) ×2 IMPLANT
CHLORAPREP W/TINT 26 (MISCELLANEOUS) ×2 IMPLANT
COVER BACK TABLE REUSABLE LG (DRAPES) ×2 IMPLANT
DRAPE 3/4 80X56 (DRAPES) ×6 IMPLANT
DRAPE C-ARM XRAY 36X54 (DRAPES) ×2 IMPLANT
DRAPE INCISE IOBAN 66X60 STRL (DRAPES) IMPLANT
DRAPE POUCH INSTRU U-SHP 10X18 (DRAPES) ×2 IMPLANT
DRESSING SURGICEL FIBRLLR 1X2 (HEMOSTASIS) ×2 IMPLANT
DRSG MEPILEX SACRM 8.7X9.8 (GAUZE/BANDAGES/DRESSINGS) ×2 IMPLANT
DRSG OPSITE POSTOP 4X6 (GAUZE/BANDAGES/DRESSINGS) ×4 IMPLANT
DRSG OPSITE POSTOP 4X8 (GAUZE/BANDAGES/DRESSINGS) ×4 IMPLANT
DRSG SURGICEL FIBRILLAR 1X2 (HEMOSTASIS) ×4
ELECT BLADE 6.5 EXT (BLADE) ×2 IMPLANT
ELECT REM PT RETURN 9FT ADLT (ELECTROSURGICAL) ×2
ELECTRODE REM PT RTRN 9FT ADLT (ELECTROSURGICAL) ×1 IMPLANT
GAUZE 4X4 16PLY ~~LOC~~+RFID DBL (SPONGE) ×2 IMPLANT
GLOVE SURG SYN 9.0  PF PI (GLOVE) ×4
GLOVE SURG SYN 9.0 PF PI (GLOVE) ×2 IMPLANT
GLOVE SURG UNDER POLY LF SZ9 (GLOVE) ×2 IMPLANT
GOWN SRG 2XL LVL 4 RGLN SLV (GOWNS) ×1 IMPLANT
GOWN STRL NON-REIN 2XL LVL4 (GOWNS) ×2
GOWN STRL REUS W/ TWL LRG LVL3 (GOWN DISPOSABLE) ×1 IMPLANT
GOWN STRL REUS W/TWL LRG LVL3 (GOWN DISPOSABLE) ×2
HEMOVAC 400CC 10FR (MISCELLANEOUS) IMPLANT
HIP FEM HD M 28 (Head) ×2 IMPLANT
HOLDER FOLEY CATH W/STRAP (MISCELLANEOUS) IMPLANT
IRRIGATION SURGIPHOR STRL (IV SOLUTION) IMPLANT
KIT PREVENA INCISION MGT 13 (CANNISTER) ×2 IMPLANT
LINER DM 28MM (Liner) ×2 IMPLANT
LINER DM SZH 28X56 (Liner) ×1 IMPLANT
MANIFOLD NEPTUNE II (INSTRUMENTS) ×2 IMPLANT
MAT ABSORB  FLUID 56X50 GRAY (MISCELLANEOUS) ×2
MAT ABSORB FLUID 56X50 GRAY (MISCELLANEOUS) ×1 IMPLANT
NDL SAFETY ECLIPSE 18X1.5 (NEEDLE) ×1 IMPLANT
NEEDLE HYPO 18GX1.5 SHARP (NEEDLE) ×2
NEEDLE SPNL 20GX3.5 QUINCKE YW (NEEDLE) ×4 IMPLANT
NS IRRIG 1000ML POUR BTL (IV SOLUTION) ×2 IMPLANT
PACK HIP COMPR (MISCELLANEOUS) ×2 IMPLANT
SCALPEL PROTECTED #10 DISP (BLADE) ×4 IMPLANT
SHELL ACETABULAR DM  56MM (Shell) ×2 IMPLANT
SOL PREP PVP 2OZ (MISCELLANEOUS)
SOLUTION PREP PVP 2OZ (MISCELLANEOUS) IMPLANT
SPONGE DRAIN TRACH 4X4 STRL 2S (GAUZE/BANDAGES/DRESSINGS) ×2 IMPLANT
SPONGE T-LAP 18X18 ~~LOC~~+RFID (SPONGE) ×4 IMPLANT
STAPLER SKIN PROX 35W (STAPLE) ×2 IMPLANT
STEM FEMORAL SZ4 LAT COLLARED (Stem) ×2 IMPLANT
STRAP SAFETY 5IN WIDE (MISCELLANEOUS) ×2 IMPLANT
SUT DVC 2 QUILL PDO  T11 36X36 (SUTURE) ×2
SUT DVC 2 QUILL PDO T11 36X36 (SUTURE) ×1 IMPLANT
SUT SILK 0 (SUTURE) ×2
SUT SILK 0 30XBRD TIE 6 (SUTURE) ×1 IMPLANT
SUT V-LOC 90 ABS DVC 3-0 CL (SUTURE) ×2 IMPLANT
SUT VIC AB 1 CT1 36 (SUTURE) ×2 IMPLANT
SYR 20ML LL LF (SYRINGE) ×2 IMPLANT
SYR 30ML LL (SYRINGE) ×2 IMPLANT
SYR 50ML LL SCALE MARK (SYRINGE) ×4 IMPLANT
SYR BULB IRRIG 60ML STRL (SYRINGE) ×2 IMPLANT
TAPE MICROFOAM 4IN (TAPE) IMPLANT
TOWEL OR 17X26 4PK STRL BLUE (TOWEL DISPOSABLE) ×2 IMPLANT
TRAY FOLEY MTR SLVR 16FR STAT (SET/KITS/TRAYS/PACK) IMPLANT
WATER STERILE IRR 500ML POUR (IV SOLUTION) ×2 IMPLANT

## 2020-12-13 NOTE — Transfer of Care (Signed)
Immediate Anesthesia Transfer of Care Note  Patient: Jesus Hobbs  Procedure(s) Performed: TOTAL HIP ARTHROPLASTY ANTERIOR APPROACH (Left: Hip)  Patient Location: PACU  Anesthesia Type:General  Level of Consciousness: awake  Airway & Oxygen Therapy: Patient Spontanous Breathing and Patient connected to face mask oxygen  Post-op Assessment: Report given to RN and Post -op Vital signs reviewed and stable  Post vital signs: Reviewed and stable  Last Vitals:  Vitals Value Taken Time  BP    Temp    Pulse    Resp    SpO2      Last Pain:  Vitals:   12/13/20 0758  TempSrc: Temporal         Complications: No notable events documented.

## 2020-12-13 NOTE — Discharge Instructions (Addendum)
AMBULATORY SURGERY  DISCHARGE INSTRUCTIONS   The drugs that you were given will stay in your system until tomorrow so for the next 24 hours you should not:  Drive an automobile Make any legal decisions Drink any alcoholic beverage   You may resume regular meals tomorrow.  Today it is better to start with liquids and gradually work up to solid foods.  You may eat anything you prefer, but it is better to start with liquids, then soup and crackers, and gradually work up to solid foods.   Please notify your doctor immediately if you have any unusual bleeding, trouble breathing, redness and pain at the surgery site, drainage, fever, or pain not relieved by medication.    Additional Instructions: Keep dressing clean wound and okay to shower.  Wound VAC should stop working in a week and that can be removed and honeycomb dressing applied Pain medicine as directed Blood thinner shot as directed Call office if you are having problems   Please contact your physician with any problems or Same Day Surgery at [phone], Monday through Friday 6 am to 4 pm, or [place] at [facility] number at [phone].

## 2020-12-13 NOTE — H&P (Signed)
Chief Complaint  Patient presents with   Pre-op Exam  Left THA scheduled 12/13/20 by Dr. Rosita Kea    History of the Present Illness: Jesus Hobbs is a 70 y.o. male here today for history and physical for left total hip arthroplasty with Dr. Kennedy Bucker on 12/13/2020. Patient had x-rays on 07/19/2020 showing severe osteoarthritis, extensive osteophytes around the femoral head, loss of superior joint space, and subchondral cyst formation on both sides of the joint. Patient has located in his groin that is worse after weightbearing activity. Pain interferes with quality of life and activities of daily living. 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 Patient will have throbbing aching pain at nighttime that will awaken him as well as throbbing aching pain after activity that prevents him from ambulating  The patient is retiring from [place] the first week of 02/2021.  I have reviewed past medical, surgical, social and family history, and allergies as documented in the EMR.  Past Medical History: Past Medical History:  Diagnosis Date   Benign essential hypertension 09/11/2016   Benign essential tremor 09/11/2016   Hyperlipidemia, mixed 09/11/2016   Past Surgical History: Past Surgical History:  Procedure Laterality Date   HERNIA REPAIR   L Knee Epidermal Inclusion Cystectomy. 07/04/2016  Kennedy Bucker, MD   no past surgery   REPAIR INGUINAL HERNIA Left 07/01/2019  Procedure: open inguinal hernia repair with mesh; Surgeon: Herbie Baltimore, MD; Location: [facility]; Service: General Surgery; Laterality: Left;   Past Family History: Family History  Problem Relation Age of Onset   No Known Problems Mother   Lung cancer Father   Hyperlipidemia (Elevated cholesterol) Maternal Aunt   Hyperlipidemia (Elevated cholesterol) Maternal Uncle   Anesthesia problems Neg Hx   Medications: Current Outpatient Medications Ordered in Epic  Medication Sig Dispense Refill    APPLE CIDER VINEGAR ORAL Take by mouth every morning   cholecalciferol (VITAMIN D3) 1000 unit capsule Take 3,000 Units by mouth every morning   cyanocobalamin (VITAMIN B12) 1000 MCG tablet Take 1,000 mcg by mouth every morning   etodolac (LODINE) 400 MG tablet Take 400 mg by mouth 2 (two) times daily (Patient not taking: Reported on 12/06/2020)   LIVALO 2 mg Tab nightly   magnesium oxide (MAG-OX) 400 mg (241.3 mg magnesium) tablet Take 400 mg by mouth once daily (Patient not taking: Reported on 12/06/2020)   olmesartan (BENICAR) 20 MG tablet Take 1 tablet (20 mg total) by mouth at bedtime 90 tablet 3   propranoloL (INDERAL) 10 MG tablet Take 1 tablet by mouth 2 (two) times daily as needed   tadalafiL (CIALIS) 10 MG tablet Take 1 tablet (10 mg total) by mouth once daily as needed for Erectile Dysfunction for up to 30 days Take dose 30-45 min prior to anticipated sexual activity. 5 tablet 5   traMADoL (ULTRAM) 50 mg tablet Take 1-2 tablets (50-100 mg total) by mouth nightly as needed for Pain for up to 60 doses 60 tablet 1   zinc sulfate (ZINC-220 ORAL) Take by mouth every morning 2 tabs daily   No current Epic-ordered facility-administered medications on file.   Allergies: No Known Allergies   Body mass index is 24.66 kg/m.  Review of Systems: A comprehensive 14 point ROS was performed, reviewed, and the pertinent orthopaedic findings are documented in the HPI.  Vitals:  12/06/20 1443  BP: 118/70    General Physical Examination:  General:  Well developed, well nourished, no  apparent distress, normal affect, minimally antalgic gait  HEENT: Head normocephalic, atraumatic, PERRL.   Abdomen: Soft, non tender, non distended, Bowel sounds present.  Heart: Examination of the heart reveals regular, rate, and rhythm. There is no murmur noted on ascultation. There is a normal apical pulse.  Lungs: Lungs are clear to auscultation. There is no wheeze, rhonchi, or crackles. There is  normal expansion of bilateral chest walls.   Left hip: On exam, left hip has 0 degrees internal rotation and 20 degrees external. Ambulates with an antalgic gait.  Radiographs: Left hip x-rays reviewed by me from 07/19/2020 showing severe osteoarthritis, extensive osteophytes around the femoral head, loss of superior joint space, and subchondral cyst formation on both sides of the joint  Assessment: ICD-10-CM  1. Primary localized osteoarthritis of left hip M16.12   Plan:  78. 70 year old male with advanced left hip osteoarthritis. Pain is severe and interfering with quality of life and activities of daily living. Risks, benefits, complications of a left total hip arthroplasty have been discussed with the patient. Patient has agreed and consented procedure with Dr. Kennedy Bucker on 12/13/2020.   Electronically signed by [name], PA at 12/06/2020 3:36 PM EDT  Reviewed  H+P. No changes noted.

## 2020-12-13 NOTE — Progress Notes (Signed)
Patient has been c/o 10/10 ankle pain on left leg. Have went through many medications.  Dr Rosita Kea aware and is ordering ankle xray. Pt denies hip pain.   1330-pt also having increased BP. Informed dr Pernell Dupre about bp as well as high pain level.  Order for labetolol placed per verbal order; dr Pernell Dupre is aware of HR

## 2020-12-13 NOTE — Anesthesia Preprocedure Evaluation (Signed)
Anesthesia Evaluation  Patient identified by MRN, date of birth, ID band Patient awake    Reviewed: Allergy & Precautions, H&P , NPO status , Patient's Chart, lab work & pertinent test results, reviewed documented beta blocker date and time   Airway Mallampati: II  TM Distance: >3 FB Neck ROM: full    Dental  (+) Teeth Intact   Pulmonary neg pulmonary ROS,    Pulmonary exam normal        Cardiovascular Exercise Tolerance: Good hypertension, On Medications negative cardio ROS Normal cardiovascular exam Rhythm:regular Rate:Normal     Neuro/Psych  Neuromuscular disease negative psych ROS   GI/Hepatic Neg liver ROS, GERD  Medicated,  Endo/Other  negative endocrine ROS  Renal/GU negative Renal ROS  negative genitourinary   Musculoskeletal   Abdominal   Peds  Hematology negative hematology ROS (+)   Anesthesia Other Findings Past Medical History: No date: Hyperglycemia No date: Hyperlipidemia No date: Hypertension Past Surgical History: No date: HERNIA REPAIR BMI    Body Mass Index: 25.41 kg/m     Reproductive/Obstetrics negative OB ROS                             Anesthesia Physical Anesthesia Plan  ASA: 2  Anesthesia Plan: General ETT   Post-op Pain Management:    Induction:   PONV Risk Score and Plan: 3  Airway Management Planned:   Additional Equipment:   Intra-op Plan:   Post-operative Plan:   Informed Consent: I have reviewed the patients History and Physical, chart, labs and discussed the procedure including the risks, benefits and alternatives for the proposed anesthesia with the patient or authorized representative who has indicated his/her understanding and acceptance.     Dental Advisory Given  Plan Discussed with: CRNA  Anesthesia Plan Comments:         Anesthesia Quick Evaluation

## 2020-12-13 NOTE — Anesthesia Procedure Notes (Signed)
Procedure Name: Intubation Date/Time: 12/13/2020 11:05 AM Performed by: Clyde Lundborg, CRNA Pre-anesthesia Checklist: Patient identified, Emergency Drugs available, Suction available and Patient being monitored Patient Re-evaluated:Patient Re-evaluated prior to induction Oxygen Delivery Method: Circle system utilized Preoxygenation: Pre-oxygenation with 100% oxygen Induction Type: IV induction Ventilation: Mask ventilation without difficulty Laryngoscope Size: McGraph and 3 Grade View: Grade I Tube type: Oral Tube size: 7.5 mm Number of attempts: 1 Airway Equipment and Method: Video-laryngoscopy Placement Confirmation: ETT inserted through vocal cords under direct vision, positive ETCO2, breath sounds checked- equal and bilateral and CO2 detector Secured at: 21 cm Tube secured with: Tape Dental Injury: Teeth and Oropharynx as per pre-operative assessment

## 2020-12-13 NOTE — Op Note (Signed)
12/13/2020  12:29 PM  PATIENT:  Jesus Hobbs  70 y.o. male  PRE-OPERATIVE DIAGNOSIS:  Primary localized osteoarthritis of left hip  M16.12  POST-OPERATIVE DIAGNOSIS:  Primary localized osteoarthritis of left hip  M16.12  PROCEDURE:  Procedure(s): TOTAL HIP ARTHROPLASTY ANTERIOR APPROACH (Left)  SURGEON: Leitha Schuller, MD  ASSISTANTS: None  ANESTHESIA:   general  EBL:  Total I/O In: 1200 [I.V.:1000; IV Piggyback:200] Out: 150 [Blood:150]  BLOOD ADMINISTERED:none  DRAINS:  Incisional wound    LOCAL MEDICATIONS USED:  MARCAINE    and OTHER Exparel  SPECIMEN: Left femoral head  DISPOSITION OF SPECIMEN:  PATHOLOGY  COUNTS:  YES  TOURNIQUET:  * No tourniquets in log *  IMPLANTS: Medacta AMIS 4 lateralized stem with 56 mm Mpact DM cup and liner with metal M 28 mm head  DICTATION: .Dragon Dictation   The patient was brought to the operating room and after general anesthesia was obtained patient was placed on the operative table with the ipsilateral foot into the Medacta attachment, contralateral leg on a well-padded table. C-arm was brought in and preop template x-ray taken. After prepping and draping in usual sterile fashion appropriate patient identification and timeout procedures were completed. Anterior approach to the hip was obtained and centered over the greater trochanter and TFL muscle. The subcutaneous tissue was incised hemostasis being achieved by electrocautery. TFL fascia was incised and the muscle retracted laterally deep retractor placed. The lateral femoral circumflex vessels were identified and ligated. The anterior capsule was exposed and a capsulotomy performed. The neck was identified and a femoral neck cut carried out with a saw. The head was removed without difficulty and showed sclerotic femoral head and acetabulum. Reaming was carried out to 56 mm and a 56 mm cup trial gave appropriate tightness to the acetabular component a 56 DM cup was impacted into  position. The leg was then externally rotated and ischiofemoral and pubofemoral releases carried out. The femur was sequentially broached to a size 4, size 4 standard and lateralized stem and as then M head trials were placed and the final components chosen. The 4 lateral stem was inserted along with a metal M 28 mm head and 56 mm liner. The hip was reduced and was stable the wound was thoroughly irrigated with fibrillar placed along the posterior capsule and medial neck. The deep fascia ws closed using a heavy Quill after infiltration of 30 cc of quarter percent Sensorcaine with Exparel throughout the case .3-0 V-loc to close the skin with skin staples.  Wound VAC applied and patient was sent to recovery in stable condition.   PLAN OF CARE: Discharge to home after PACU

## 2020-12-13 NOTE — Evaluation (Signed)
Physical Therapy Evaluation Patient Details Name: Jesus Hobbs MRN: [id number] DOB: 01/09/1951 Today's Date: 12/13/2020  History of Present Illness  Jesus Hobbs is a 70 y.o. male with PMH of hypertension, essential tremors, and hyperlipidemia and presents for elective left total hip arthroplasty with Dr. Kennedy Bucker on 12/13/2020.   Clinical Impression  Pt is a pleasant 70 year old male who presents POD#0 L THA anterior approach. Pt reports that he was independent prior to admission without usage of assistive devices and was very physically active. Pt and spouse reporting he will have 24/7 assistance at discharge and has to negotiate 2 steps without railings. Upon evaluation, pt demonstrating bed mobility mod I with usage of rails, sit to stand with SPV + RW, and ambulate 80 ft x 2 with CGA/SBA + RW. Pt negotiated 2 steps with RW + verbal cues for sequencing and safety. Pt and spouse educated on wound vac precautions, anterior hip precautions, safety/guarding with stair climbing, car transfer, and home exercise plan. Recommending home with support of spouse and home health PT. Awaiting DME equipment for delivery. No further acute PT needs at this time, will sign off on orders.    This entire session was guided, instructed, and directly supervised by Yavier Snider Palau, DPT.    Recommendations for follow up therapy are one component of a multi-disciplinary discharge planning process, led by the attending physician.  Recommendations may be updated based on patient status, additional functional criteria and insurance authorization.  Follow Up Recommendations Home health PT    Assistance Recommended at Discharge Intermittent Supervision/Assistance  Functional Status Assessment Patient has had a recent decline in their functional status and demonstrates the ability to make significant improvements in function in a reasonable and predictable amount of time.  Equipment Recommendations  Rolling  walker (2 wheels);BSC/3in1    Recommendations for Other Services       Precautions / Restrictions Precautions Precautions: Anterior Hip Restrictions Weight Bearing Restrictions: Yes LLE Weight Bearing: Weight bearing as tolerated      Mobility  Bed Mobility Overal bed mobility: Modified Independent             General bed mobility comments: Usage of rails to assist to standing. Able to move B LEs off EOB by hoking R foot under L to assist    Transfers Overall transfer level: Needs assistance Equipment used: Rolling walker (2 wheels) Transfers: Sit to/from Stand Sit to Stand: Supervision           General transfer comment: From elevated surface and recliner chair    Ambulation/Gait Ambulation/Gait assistance: Min guard Gait Distance (Feet): 80 Feet x 2 Assistive device: Rolling walker (2 wheels) Gait Pattern/deviations: Step-through pattern;Decreased stride length;Antalgic;Narrow base of support Gait velocity: decreased     General Gait Details: Initially step to gait but able to progress to step through gait with cues. Demonstrating decreased stride length. Pt cued to decrease cadence for safety  Stairs Stairs: Yes Stairs assistance: Min guard Stair Management: No rails;Step to pattern;Backwards;Forwards;With walker Number of Stairs: 2 General stair comments: Demonstrated stair climbing going backwards for ascending and forwards for descending with RW. Educated pt and spouse on proper guarding for safety. Pt completed 2 steps with RW + CGA and verbal cues for sequencing.  Wheelchair Mobility    Modified Rankin (Stroke Patients Only)       Balance Overall balance assessment: Needs assistance Sitting-balance support: No upper extremity supported;Feet supported Sitting balance-Leahy Scale: Good     Standing balance support: Bilateral  upper extremity supported;Reliant on assistive device for balance;During functional activity Standing balance-Leahy  Scale: Good Standing balance comment: Demonstrates good static and dynamic balance with usage of RW. Able to stand statically with 1 UE while voiding in standing                             Pertinent Vitals/Pain Pain Assessment: No/denies pain    Home Living Family/patient expects to be discharged to:: Private residence Living Arrangements: Spouse/significant other Available Help at Discharge: Family;Available 24 hours/day Type of Home: House Home Access: Stairs to enter Entrance Stairs-Rails: None Entrance Stairs-Number of Steps: 2   Home Layout: One level Home Equipment: Rollator (4 wheels) Additional Comments: Reports that spouse will be with him 24/7 supervision/assistance. Reports living very active lifestyle prior to surgery    Prior Function Prior Level of Function : Independent/Modified Independent;Working/employed             Mobility Comments: Independent with all mobility and leisure activities ADLs Comments: Independent     Hand Dominance        Extremity/Trunk Assessment   Upper Extremity Assessment Upper Extremity Assessment: Overall WFL for tasks assessed    Lower Extremity Assessment Lower Extremity Assessment: LLE deficits/detail LLE Deficits / Details: Difficulty with active L hip flexion and abduction during motion/strength testing due to surgical intervention. Within functional limits for all other mobility tasks. LLE Sensation: WNL       Communication   Communication: No difficulties  Cognition Arousal/Alertness: Awake/alert Behavior During Therapy: WFL for tasks assessed/performed Overall Cognitive Status: Within Functional Limits for tasks assessed                                          General Comments      Exercises Total Joint Exercises Ankle Circles/Pumps: AROM;Both;10 reps;Supine Quad Sets: AROM;Left;5 reps;Supine Gluteal Sets: AROM;Strengthening;Left;5 reps;Supine Short Arc Quad:  AROM;Strengthening;Left;5 reps;Supine Heel Slides: AAROM;Strengthening;Left;5 reps;Supine Hip ABduction/ADduction: AAROM;Strengthening;Left;5 reps;Supine Straight Leg Raises: PROM;Left;5 reps;Supine Long Arc Quad: AROM;Strengthening;Left;10 reps;Seated Other Exercises Other Exercises: Pt ambulated to the bathrom with RW + SBA/CGA for safety. Pt stood with 1 UE assist on grab bar and spouse present to guard patient for safety. Other Exercises: Pt and spouse educated on stair climbing, car transfer, wound vac, home exercise program (with handout) and anterior hip precautions. All questions answered at this time.   Assessment/Plan    PT Assessment Patient needs continued PT services  PT Problem List Decreased strength;Decreased range of motion;Decreased activity tolerance;Decreased balance;Decreased mobility;Decreased knowledge of use of DME;Decreased safety awareness;Decreased knowledge of precautions;Pain       PT Treatment Interventions DME instruction;Gait training;Stair training;Functional mobility training;Therapeutic activities;Therapeutic exercise;Neuromuscular re-education;Balance training;Cognitive remediation;Patient/family education;Manual techniques;Modalities    PT Goals (Current goals can be found in the Care Plan section)  Acute Rehab PT Goals Patient Stated Goal: to go home PT Goal Formulation: With patient/family Time For Goal Achievement: 12/13/20 Potential to Achieve Goals: Good    Frequency BID   Barriers to discharge        Co-evaluation               AM-PAC PT "6 Clicks" Mobility  Outcome Measure Help needed turning from your back to your side while in a flat bed without using bedrails?: None Help needed moving from lying on your back to sitting on the side of  a flat bed without using bedrails?: A Little Help needed moving to and from a bed to a chair (including a wheelchair)?: A Little Help needed standing up from a chair using your arms (e.g.,  wheelchair or bedside chair)?: A Little Help needed to walk in hospital room?: A Little Help needed climbing 3-5 steps with a railing? : A Little 6 Click Score: 19    End of Session Equipment Utilized During Treatment: Gait belt Activity Tolerance: Patient tolerated treatment well Patient left: in chair;with call bell/phone within reach;with chair alarm set;with family/visitor present Nurse Communication: Mobility status PT Visit Diagnosis: Other abnormalities of gait and mobility (R26.89);Muscle weakness (generalized) (M62.81)    Time: 6546-5035 PT Time Calculation (min) (ACUTE ONLY): 50 min   Charges:   PT Evaluation $PT Eval Low Complexity: 1 Low PT Treatments $Gait Training: 8-22 mins $Therapeutic Exercise: 8-22 mins $Therapeutic Activity: 8-22 mins        Verl Blalock, SPT   Verl Blalock 12/13/2020, 4:21 PM

## 2020-12-13 NOTE — Discharge Summary (Signed)
Physician Discharge Summary  Patient ID: Jesus Hobbs MRN: [id number] DOB/AGE: 03-20-1950 70 y.o.  Admit date: 12/13/2020 Discharge date: 12/13/2020  Admission Diagnoses:  Primary localized osteoarthritis of left hip  M16.12   Discharge Diagnoses: Patient Active Problem List   Diagnosis Date Noted   URI 03/19/2007   CONJUNCTIVITIS, ACUTE 09/22/2006   INJURY, CRUSHING, FINGER 09/22/2006   HYPERCHOLESTEROLEMIA, MIXED 07/06/2006   RHINITIS, ALLERGIC NOS 07/06/2006   ASTHMA, EXERCISE INDUCED BRONCHOSPASM 07/06/2006   GERD 07/06/2006   BENIGN PROSTATIC HYPERTROPHY, MILD, HX OF 07/06/2006    Past Medical History:  Diagnosis Date   Hyperglycemia    Hyperlipidemia    Hypertension      Transfusion: none   Consultants (if any):   Discharged Condition: Improved  Hospital Course: DOM HAVERLAND is an 70 y.o. male who was admitted 12/13/2020 with a diagnosis of left hip osteoarthritis and went to the operating room on 12/13/2020 and underwent the above named procedures.    Surgeries: Procedure(s): TOTAL HIP ARTHROPLASTY ANTERIOR APPROACH on 12/13/2020 Patient tolerated the surgery well. Taken to PACU where she was stabilized and then transferred to the orthopedic floor.  Started on Lovenox 40 mg q 24 hrs. Foot pumps applied bilaterally at 80 mm. Heels elevated on bed with rolled towels. No evidence of DVT. Negative Homan. Physical therapy started day of surgery gait training and transfer. OT started day #1 for ADL and assisted devices.  On day of surgery patient was stable and ready for discharge to home with HHPT.    He was given perioperative antibiotics:  Anti-infectives (From admission, onward)    Start     Dose/Rate Route Frequency Ordered Stop   12/13/20 1915  ceFAZolin (ANCEF) IVPB 2g/100 mL premix  Status:  Discontinued        2 g 200 mL/hr over 30 Minutes Intravenous Every 8 hours 12/13/20 1256 12/13/20 1454   12/13/20 1800  ceFAZolin (ANCEF) IVPB 2g/100  mL premix        2 g 200 mL/hr over 30 Minutes Intravenous Every 8 hours 12/13/20 1454     12/13/20 0754  ceFAZolin (ANCEF) 2-4 GM/100ML-% IVPB       Note to Pharmacy: Kerman Passey, Cryst: cabinet override      12/13/20 0754 12/13/20 1115   12/13/20 0600  ceFAZolin (ANCEF) IVPB 2g/100 mL premix        2 g 200 mL/hr over 30 Minutes Intravenous On call to O.R. 12/13/20 0119 12/13/20 1114     .  He was given sequential compression devices, early ambulation, and Lovenox for DVT prophylaxis.  He benefited maximally from the hospital stay and there were no complications.    Recent vital signs:  Vitals:   12/13/20 1441 12/13/20 1530  BP: (!) 164/86 126/78  Pulse: (!) 56 60  Resp: 16 16  Temp: 97.8 F (36.6 C)   SpO2: 100% 99%    Recent laboratory studies:  Lab Results  Component Value Date   HGB 14.0 11/29/2020   Lab Results  Component Value Date   WBC 4.0 11/29/2020   PLT 167 11/29/2020   No results found for: INR Lab Results  Component Value Date   NA 138 11/29/2020   K 3.9 11/29/2020   CL 102 11/29/2020   CO2 28 11/29/2020   BUN 16 11/29/2020   CREATININE 0.96 11/29/2020   GLUCOSE 143 (H) 11/29/2020    Discharge Medications:   Allergies as of 12/13/2020       Reactions  Loratadine-pseudoephedrine Er    REACTION: unspecified   Mometasone Furoate    REACTION: unspecified   Prednisone    REACTION: unspecified        Medication List     TAKE these medications    Advil Dual Action 125-250 MG Tabs Generic drug: Ibuprofen-Acetaminophen Take 1 tablet by mouth daily.   APPLE CIDER VINEGAR DIET PO Take 450 mg by mouth daily.   Cholecalciferol 25 MCG (1000 UT) capsule Take 4,000 Units by mouth daily.   Cyanocobalamin 1500 MCG Tbdp Take 1,500 mcg by mouth daily. sublingual   docusate sodium 100 MG capsule Commonly known as: COLACE Take 1 capsule (100 mg total) by mouth 2 (two) times daily.   enoxaparin 40 MG/0.4ML injection Commonly known as:  LOVENOX Inject 0.4 mLs (40 mg total) into the skin daily for 14 days. Start taking on: December 14, 2020   HYDROcodone-acetaminophen 7.5-325 MG tablet Commonly known as: NORCO Take 1-2 tablets by mouth every 4 (four) hours as needed for severe pain (pain score 7-10).   Livalo 2 MG Tabs Generic drug: Pitavastatin Calcium TAKE 1 TABLET(2 MG) BY MOUTH DAILY What changed: See the new instructions.   methocarbamol 500 MG tablet Commonly known as: ROBAXIN Take 1 tablet (500 mg total) by mouth every 6 (six) hours as needed for muscle spasms.   olmesartan 20 MG tablet Commonly known as: BENICAR Take 1 tablet (20 mg total) by mouth daily.   polyethylene glycol 17 g packet Commonly known as: MiraLax Mix-In Pax Take 17 g by mouth daily.   propranolol 10 MG tablet Commonly known as: INDERAL Take 1 tablet (10 mg total) by mouth daily as needed. What changed: reasons to take this   traMADol 50 MG tablet Commonly known as: ULTRAM Take 1 tablet (50 mg total) by mouth every 6 (six) hours. Start taking on: December 14, 2020 What changed:  how much to take when to take this reasons to take this   zinc gluconate 50 MG tablet Take 50 mg by mouth daily.               Durable Medical Equipment  (From admission, onward)           Start     Ordered   12/13/20 1252  DME Walker rolling  Once       Question Answer Comment  Walker: With 5 Inch Wheels   Patient needs a walker to treat with the following condition Status post total hip replacement, left      12/13/20 1251   12/13/20 1252  DME 3 n 1  Once        12/13/20 1251   12/13/20 1252  DME Bedside commode  Once       Question:  Patient needs a bedside commode to treat with the following condition  Answer:  Status post total hip replacement, left   12/13/20 1251            Diagnostic Studies: DG Ankle 2 Views Left  Result Date: 12/13/2020 CLINICAL DATA:  Left ankle pain EXAM: LEFT ANKLE - 2 VIEW COMPARISON:  None.  FINDINGS: Normal alignment.  Normal joint space.  Negative for acute fracture Small well corticated bone fragments in the soft tissues below the medial malleolus likely due to chronic injury. Mild calcaneal spurring. IMPRESSION: Negative for acute fracture.  Chronic injury medial malleolus. Electronically Signed   By: Marlan Palau M.D.   On: 12/13/2020 14:28   DG HIP OPERATIVE UNILAT W  OR W/O PELVIS LEFT  Result Date: 12/13/2020 CLINICAL DATA:  Status post left hip replacement. EXAM: OPERATIVE left HIP (WITH PELVIS IF PERFORMED) 4 VIEWS TECHNIQUE: Fluoroscopic spot image(s) were submitted for interpretation post-operatively. Radiation exposure index: 3.1 mGy. COMPARISON:  None. FINDINGS: Four intraoperative fluoroscopic images were obtained of the left hip. The left femoral and acetabular components are well situated. IMPRESSION: Fluoroscopic guidance provided during left total hip arthroplasty. Electronically Signed   By: Lupita Raider M.D.   On: 12/13/2020 12:48   DG HIP UNILAT W OR W/O PELVIS 2-3 VIEWS LEFT  Result Date: 12/13/2020 CLINICAL DATA:  Left hip arthroplasty EXAM: DG HIP (WITH OR WITHOUT PELVIS) 2-3V LEFT COMPARISON:  None. FINDINGS: Postsurgical changes from left total hip arthroplasty. Arthroplasty components are in their expected alignment. No periprosthetic fracture or evidence of other complication. Expected postoperative changes within the overlying soft tissues. IMPRESSION: Satisfactory postoperative appearance status post left total hip arthroplasty. Electronically Signed   By: Duanne Guess D.O.   On: 12/13/2020 13:25    Disposition: Discharge disposition: 01-Home or Self Care         Follow-up Information     Kennedy Bucker, MD Follow up.   Specialty: Orthopedic Surgery Contact information: [address] [place] [address] [phone]                  Signed: [name] 12/13/2020, 4:36  PM

## 2020-12-16 ENCOUNTER — Encounter: Payer: Self-pay | Admitting: Orthopedic Surgery

## 2020-12-17 LAB — SURGICAL PATHOLOGY

## 2020-12-18 NOTE — Anesthesia Postprocedure Evaluation (Signed)
Anesthesia Post Note  Patient: Jesus Hobbs  Procedure(s) Performed: TOTAL HIP ARTHROPLASTY ANTERIOR APPROACH (Left: Hip)  Patient location during evaluation: PACU Anesthesia Type: General Level of consciousness: awake and alert Pain management: pain level controlled Vital Signs Assessment: post-procedure vital signs reviewed and stable Respiratory status: spontaneous breathing, nonlabored ventilation, respiratory function stable and patient connected to nasal cannula oxygen Cardiovascular status: blood pressure returned to baseline and stable Postop Assessment: no apparent nausea or vomiting Anesthetic complications: no   No notable events documented.   Last Vitals:  Vitals:   12/13/20 1530 12/13/20 1708  BP: 126/78 126/76  Pulse: 60 79  Resp: 16 16  Temp:  36.6 C  SpO2: 99% 99%    Last Pain:  Vitals:   12/14/20 0831  TempSrc:   PainSc: 5                  Yevette Edwards

## 2020-12-24 ENCOUNTER — Ambulatory Visit: Payer: 59 | Admitting: Cardiology

## 2021-01-21 ENCOUNTER — Other Ambulatory Visit: Payer: Self-pay | Admitting: Cardiology

## 2021-01-21 DIAGNOSIS — E782 Mixed hyperlipidemia: Secondary | ICD-10-CM

## 2021-01-22 ENCOUNTER — Other Ambulatory Visit: Payer: Self-pay | Admitting: Cardiology

## 2021-01-22 DIAGNOSIS — E782 Mixed hyperlipidemia: Secondary | ICD-10-CM

## 2021-01-24 ENCOUNTER — Other Ambulatory Visit: Payer: Self-pay

## 2021-01-24 DIAGNOSIS — E782 Mixed hyperlipidemia: Secondary | ICD-10-CM

## 2021-01-24 MED ORDER — LIVALO 2 MG PO TABS: 2.0000 mg | ORAL_TABLET | Freq: Every day | ORAL | 1 refills | Status: DC

## 2021-02-14 ENCOUNTER — Ambulatory Visit: Payer: Medicare Other | Admitting: Cardiology

## 2021-02-14 ENCOUNTER — Other Ambulatory Visit: Payer: Self-pay

## 2021-02-14 ENCOUNTER — Encounter: Payer: Self-pay | Admitting: Cardiology

## 2021-02-14 VITALS — BP 130/76 | HR 59 | Temp 98.0°F | Ht 60.0 in | Wt 183.0 lb

## 2021-02-14 DIAGNOSIS — E782 Mixed hyperlipidemia: Secondary | ICD-10-CM

## 2021-02-14 DIAGNOSIS — R739 Hyperglycemia, unspecified: Secondary | ICD-10-CM

## 2021-02-14 DIAGNOSIS — I10 Essential (primary) hypertension: Secondary | ICD-10-CM

## 2021-02-14 MED ORDER — ZYPITAMAG 2 MG PO TABS: 1.0000 | ORAL_TABLET | Freq: Every day | ORAL | 3 refills | Status: DC

## 2021-02-14 NOTE — Progress Notes (Signed)
Primary Physician/Referring:  Rusty Aus, MD  Patient ID: Jesus Hobbs, male    DOB: [birth year], 71 y.o.   MRN: [id number]  Chief Complaint  Patient presents with   Hypertension   Follow-up   Hyperlipidemia   HPI:    Jesus Hobbs  is a 71 y.o. Caucasian male patient with  hypertension and hyperlipidemia. Last stress test was in June 2012 was normal with 12.5 METs. Echocardiogram in 2012 was normal with mild LVH and mild left atrial enlargement.  This is his annual visit. He is presently tolerating Livalo without any side effects, previously did not tolerate Crestor or Lipitor. He is essentially asymptomatic.  He also has elevated triglycerides and was on omega-3 fatty acid but did not tolerate this due to abdominal discomfort.  He has lost about 18 to 20 pounds in weight with diet and exercise.  He underwent left hip arthroplasty recently without any periprocedural complications.  Past Medical History:  Diagnosis Date   Hyperglycemia    Hyperlipidemia    Hypertension    Past Surgical History:  Procedure Laterality Date   HERNIA REPAIR     TOTAL HIP ARTHROPLASTY Left 12/13/2020   Procedure: TOTAL HIP ARTHROPLASTY ANTERIOR APPROACH;  Surgeon: Hessie Knows, MD;  Location: [facility] ORS;  Service: Orthopedics;  Laterality: Left;     Social History   Tobacco Use   Smoking status: Never   Smokeless tobacco: Never  Substance Use Topics   Alcohol use: Never  Marital Status: Married    ROS  Review of Systems  Cardiovascular:  Negative for chest pain, dyspnea on exertion and leg swelling.  Gastrointestinal:  Negative for melena.  Neurological:  Positive for tremors.  Objective   Vitals with BMI 02/14/2021 12/13/2020 12/13/2020  Height _0  - -  Weight 183 lbs - -  BMI 95.28 - -  Systolic 413 244 010  Diastolic 76 76 78  Pulse 59 79 60    Physical Exam Neck:     Vascular: No carotid bruit or JVD.  Cardiovascular:     Rate and Rhythm: Normal rate and regular  rhythm.     Pulses: Intact distal pulses.     Heart sounds: Normal heart sounds. No murmur heard.   No gallop.  Pulmonary:     Effort: Pulmonary effort is normal.     Breath sounds: Normal breath sounds.  Abdominal:     General: Bowel sounds are normal.     Palpations: Abdomen is soft.  Musculoskeletal:        General: No swelling.   Laboratory examination:   Labs 10/04/2019:  Hb 15.1/HCT 44.7, platelets 186, normal indicis.  Serum glucose 126, BUN 10, creatinine 0.95, EGFR >60 mL.  Total cholesterol 164, triglyceride 200, HDL 43, LDL 87.  A1c 6.6%.  TSH normal.  Vitamin D normal at 36.9.  Labs 09/21/2017: HB 15.9/HCT 49.0, normal indicis, serum glucose 127 mg, BUN 19, creatinine 1.1, eGFR greater than 60 well.  A1c 6.4%.  Total cholesterol 167, triglycerides 160, HDL 44, LDL 91.  TSH normal.  Medications and allergies   Allergies  Allergen Reactions   Loratadine-Pseudoephedrine Er     REACTION: unspecified   Mometasone Furoate     REACTION: unspecified   Prednisone     REACTION: unspecified    Current Outpatient Medications on File Prior to Visit  Medication Sig Dispense Refill   APPLE CIDER VINEGAR PO Take by mouth.     aspirin EC 81 MG tablet Take  81 mg by mouth daily. Swallow whole.     Cholecalciferol 25 MCG (1000 UT) capsule Take 4,000 Units by mouth daily.     Cyanocobalamin 1500 MCG TBDP Take 1,500 mcg by mouth daily. sublingual     docusate sodium (COLACE) 100 MG capsule Take 1 capsule (100 mg total) by mouth 2 (two) times daily. 10 capsule 0   olmesartan (BENICAR) 20 MG tablet Take 1 tablet (20 mg total) by mouth daily. 90 tablet 3   propranolol (INDERAL) 10 MG tablet Take 1 tablet (10 mg total) by mouth daily as needed. (Patient taking differently: Take 10 mg by mouth daily as needed (Tremors).) 60 tablet 2   zinc gluconate 50 MG tablet Take 50 mg by mouth daily.     enoxaparin (LOVENOX) 40 MG/0.4ML injection Inject 0.4 mLs (40 mg total) into the skin daily  for 14 days. 5.6 mL 0   Misc Natural Products (APPLE CIDER VINEGAR DIET PO) Take 450 mg by mouth daily.     polyethylene glycol (MIRALAX MIX-IN PAX) 17 g packet Take 17 g by mouth daily. 14 each 0   zolpidem (AMBIEN) 5 MG tablet Take 5 mg by mouth at bedtime as needed. (Patient not taking: Reported on 02/14/2021)     No current facility-administered medications on file prior to visit.    Radiology:  No results found. Cardiac Studies:   Carotid duplex 11/04/10: Normal carotid duplex. Normal intimal medial thickness  Echo 11/04/10: Normal LVEF. Mild LVH. Mild left atrial enlargement.    Stress EKG 07/08/10 Normal Stress EKG. 12.5 METS.    EKG:  EKG 12/26/2019: Sinus bradycardia at rate of 53 bpm, normal axis, nonspecific T abnormality.  No significant change from 12/27/2018.  Assessment     ICD-10-CM   1. Essential hypertension  I10 EKG 12-Lead    2. Mixed hyperlipidemia  E78.2 Lipid Panel With LDL/HDL Ratio    3. Hyperglycemia  R73.9       Meds ordered this encounter  Medications   Pitavastatin Magnesium (ZYPITAMAG) 2 MG TABS    Sig: Take 1 tablet by mouth daily.    Dispense:  90 tablet    Refill:  3   Medications Discontinued During This Encounter  Medication Reason   methocarbamol (ROBAXIN) 950 MG tablet Duplicate   traMADol (ULTRAM) 50 MG tablet Duplicate   methocarbamol (ROBAXIN) 500 MG tablet    Ibuprofen-Acetaminophen (ADVIL DUAL ACTION) 125-250 MG TABS    HYDROcodone-acetaminophen (NORCO) 7.5-325 MG tablet    traMADol (ULTRAM) 50 MG tablet    Pitavastatin Calcium (LIVALO) 2 MG TABS Reorder     Recommendations:    Jesus Hobbs  is a 71 y.o. Caucasian male patient with benign essential tremors and takes propranolol on a as needed basis, hypertension and hyperlipidemia. Last stress test was in June 2012 was normal with 12.5 METs. Echocardiogram in 2012 was normal with mild LVH and mild left atrial enlargement.  This is annual visit.  Mild EKG abnormality with  low voltage P waves are probably normal limits.  Blood pressure is well controlled.  Lipids are at goal with LDL <100.  He has lost about 18 to 20 pounds in weight over the past 6 months.  I would like to obtain baseline labs as he has discontinued omega-3 fatty acids due to severe intolerance from abdominal discomfort.  We discussed regarding continued diet and exercise and not to gain weight back.  To improve his compliance with lifestyle modification, I will see him back  in 6 months and if he remains stable, I will see him back on a as needed basis.  Zypetamag (Livalo) sent to [facility] -  discounted rate.  Previously was not able to tolerate any of the statins.   Adrian Prows, MD, [facility] 02/14/2021, 9:16 AM Office: [phone] Pager: [phone]

## 2021-07-21 ENCOUNTER — Other Ambulatory Visit: Payer: Self-pay | Admitting: Cardiology

## 2021-07-21 DIAGNOSIS — E782 Mixed hyperlipidemia: Secondary | ICD-10-CM

## 2021-07-24 ENCOUNTER — Telehealth: Payer: Self-pay | Admitting: Cardiology

## 2021-07-24 ENCOUNTER — Other Ambulatory Visit: Payer: Self-pay

## 2021-07-24 MED ORDER — ZYPITAMAG 2 MG PO TABS: 1.0000 | ORAL_TABLET | Freq: Every day | ORAL | 3 refills | Status: DC

## 2021-07-24 NOTE — Telephone Encounter (Signed)
Done

## 2021-07-24 NOTE — Telephone Encounter (Signed)
Patient calling about prescription, says [facility] told him there are no refills for this per our office. Please call him at number provided.

## 2021-07-26 ENCOUNTER — Telehealth: Payer: Self-pay

## 2021-07-26 NOTE — Telephone Encounter (Signed)
VM 1110:   Patient pharmacy asked if the can change patients Zypitamag to [facility]. They do not have any in stock. Please advise.

## 2021-08-02 NOTE — Telephone Encounter (Signed)
Called patient pharmacy, they noted that change can be made.

## 2021-08-16 ENCOUNTER — Ambulatory Visit: Payer: Medicare Other | Admitting: Cardiology

## 2021-09-03 ENCOUNTER — Encounter: Payer: Self-pay | Admitting: Cardiology

## 2021-09-03 ENCOUNTER — Ambulatory Visit: Payer: Medicare Other | Admitting: Cardiology

## 2021-09-03 VITALS — BP 117/71 | HR 64 | Temp 97.3°F | Resp 16 | Ht 60.0 in | Wt 179.6 lb

## 2021-09-03 DIAGNOSIS — E782 Mixed hyperlipidemia: Secondary | ICD-10-CM

## 2021-09-03 DIAGNOSIS — I10 Essential (primary) hypertension: Secondary | ICD-10-CM

## 2021-09-03 DIAGNOSIS — R739 Hyperglycemia, unspecified: Secondary | ICD-10-CM

## 2021-09-03 MED ORDER — LIVALO 2 MG PO TABS: 1.0000 | ORAL_TABLET | Freq: Every day | ORAL | 3 refills | Status: AC

## 2021-09-03 NOTE — Progress Notes (Signed)
Primary Physician/Referring:  Rusty Aus, MD  Patient ID: Jesus Hobbs, male    DOB: 11-03-1950, 71 y.o.   MRN: [id number]  Chief Complaint  Patient presents with   Hypertension   Hyperlipidemia   Follow-up    6 month   HPI:    Jesus Hobbs  is a 71 y.o. Caucasian male patient with  hypertension and hyperlipidemia. Last stress test was in June 2012 was normal with 12.5 METs. Echocardiogram in 2012 was normal with mild LVH and mild left atrial enlargement.  This is his annual visit. He is presently tolerating Livalo without any side effects, previously did not tolerate Crestor or Lipitor. He is essentially asymptomatic. He has lost about 18 to 20 pounds in weight with diet and exercise.    Past Medical History:  Diagnosis Date   Hyperglycemia    Hyperlipidemia    Hypertension    Past Surgical History:  Procedure Laterality Date   HERNIA REPAIR     TOTAL HIP ARTHROPLASTY Left 12/13/2020   Procedure: TOTAL HIP ARTHROPLASTY ANTERIOR APPROACH;  Surgeon: Hessie Knows, MD;  Location: [facility] ORS;  Service: Orthopedics;  Laterality: Left;     Social History   Tobacco Use   Smoking status: Never   Smokeless tobacco: Never  Substance Use Topics   Alcohol use: Never  Marital Status: Married    ROS  Review of Systems  Cardiovascular:  Negative for chest pain, dyspnea on exertion and leg swelling.  Gastrointestinal:  Negative for melena.  Neurological:  Positive for tremors.   Objective      09/03/2021    2:58 PM 02/14/2021    8:32 AM 12/13/2020    5:08 PM  Vitals with BMI  Height 5' 0"  5' 0"    Weight 179 lbs 10 oz 183 lbs   BMI 01.60 10.93   Systolic 235 573 220  Diastolic 71 76 76  Pulse 64 59 79    Physical Exam Neck:     Vascular: No carotid bruit or JVD.  Cardiovascular:     Rate and Rhythm: Normal rate and regular rhythm.     Pulses: Intact distal pulses.     Heart sounds: Normal heart sounds. No murmur heard.    No gallop.  Pulmonary:      Effort: Pulmonary effort is normal.     Breath sounds: Normal breath sounds.  Abdominal:     General: Bowel sounds are normal.     Palpations: Abdomen is soft.  Musculoskeletal:        General: No swelling.    Laboratory examination:   Labs 10/04/2019:  Labs 10/15/2020:  A1c 6.3%.  Labs 07/19/2020:  Hb 16.0/HCT 48.5, platelets 195.  Normal indicis.  Serum glucose 124 mg, BUN 18, creatinine 1.1, EGFR of 64 mL, potassium 4.4, LFTs normal.  Total cholesterol 159, triglycerides 150, HDL 44, LDL 89.  Hb 15.1/HCT 44.7, platelets 186, normal indicis.  A1c 6.6%.  TSH normal.  Vitamin D normal at 36.9.  Medications and allergies   Allergies  Allergen Reactions   Loratadine-Pseudoephedrine Er     REACTION: unspecified   Mometasone Furoate     REACTION: unspecified   Prednisone     REACTION: unspecified   Tall Ragweed      Current Outpatient Medications:    APPLE CIDER VINEGAR PO, Take by mouth., Disp: , Rfl:    aspirin EC 81 MG tablet, Take 81 mg by mouth daily. Swallow whole., Disp: , Rfl:    Cholecalciferol  25 MCG (1000 UT) capsule, Take 5 capsules by mouth daily., Disp: , Rfl:    Cyanocobalamin 1500 MCG TBDP, Take 1,500 mcg by mouth daily. sublingual, Disp: , Rfl:    docusate sodium (COLACE) 100 MG capsule, Take 1 capsule (100 mg total) by mouth 2 (two) times daily., Disp: 10 capsule, Rfl: 0   propranolol (INDERAL) 10 MG tablet, Take 1 tablet (10 mg total) by mouth daily as needed. (Patient taking differently: Take 10 mg by mouth daily as needed (Tremors).), Disp: 60 tablet, Rfl: 2   zinc gluconate 50 MG tablet, Take 50 mg by mouth daily., Disp: , Rfl:    LIVALO 2 MG TABS, Take 1 tablet (2 mg total) by mouth daily., Disp: 90 tablet, Rfl: 3    Radiology:  No results found. Cardiac Studies:   Carotid duplex 11/04/10: Normal carotid duplex. Normal intimal medial thickness  Echo 11/04/10: Normal LVEF. Mild LVH. Mild left atrial enlargement.    Stress EKG 07/08/10 Normal  Stress EKG. 12.5 METS.    EKG:  EKG 09/03/2021: Normal sinus rhythm at rate of 61 bpm, normal axis.  Incomplete right bundle branch block.  Nonspecific T abnormality.  No change from 12/26/2019.  Assessment     ICD-10-CM   1. Primary hypertension  I10 EKG 12-Lead    2. Mixed hyperlipidemia  E78.2     3. Hyperglycemia  R73.9       Meds ordered this encounter  Medications   LIVALO 2 MG TABS    Sig: Take 1 tablet (2 mg total) by mouth daily.    Dispense:  90 tablet    Refill:  3   Medications Discontinued During This Encounter  Medication Reason   Misc Natural Products (APPLE CIDER VINEGAR DIET PO)    polyethylene glycol (MIRALAX MIX-IN PAX) 17 g packet    zolpidem (AMBIEN) 5 MG tablet    enoxaparin (LOVENOX) 40 MG/0.4ML injection    olmesartan (BENICAR) 20 MG tablet    Pitavastatin Magnesium (ZYPITAMAG) 2 MG TABS Entry Error   LIVALO 2 MG TABS Reorder     Recommendations:   Jesus Hobbs  is a 71 y.o. Caucasian male patient with benign essential tremors and takes propranolol on a as needed basis, hypertension and hyperlipidemia. Last stress test was in June 2012 was normal with 12.5 METs. Echocardiogram in 2012 was normal with mild LVH and mild left atrial enlargement.  This is his annual visit. He is presently tolerating Livalo without any side effects, previously did not tolerate Crestor or Lipitor. He is essentially asymptomatic. He has lost about 18 to 20 pounds in weight with diet and exercise.  I suspect his lipids will much improved especially triglycerides.  I reviewed his labs from June 2022 and he is scheduled for repeat labs with his PCP in a month or 2.  Triglycerides are already improved.  He is tolerating Livalo without any complications and is unable to tolerate other statins.  Suspect his hyperglycemia will also improve with weight loss.  Pressure is also well controlled on olmesartan.  I did not make any changes to his medications.  I congratulated him on  having made lifestyle changes, he likes to see me on an annual basis, will continue the same for now.   Adrian Prows, MD, [facility] 09/03/2021, 7:52 PM Office: [phone] Pager: [phone]

## 2022-04-09 ENCOUNTER — Telehealth: Payer: Self-pay

## 2022-04-09 ENCOUNTER — Encounter: Payer: Self-pay | Admitting: Cardiology

## 2022-04-09 NOTE — Telephone Encounter (Signed)
Sent mychart message

## 2022-04-09 NOTE — Telephone Encounter (Signed)
Patient is calling to let you know his tremors seem to be getting worse. He said you mentioned a new medication at his last visit for the tremor and is wanting to know if this is something you can call in for him. He said he is happy to come in an see you first if you need him to, but if not you can just call into his pharmacy. He describes the tremor as inconsistent and sometimes he can control it, but he has noticed it worsening and it is making it more difficult for him to play golf.

## 2022-06-20 ENCOUNTER — Other Ambulatory Visit: Payer: Self-pay

## 2022-06-20 DIAGNOSIS — G250 Essential tremor: Secondary | ICD-10-CM

## 2022-06-20 MED ORDER — PROPRANOLOL HCL 10 MG PO TABS: 10.0000 mg | ORAL_TABLET | Freq: Every day | ORAL | 0 refills | Status: DC | PRN

## 2022-09-04 ENCOUNTER — Ambulatory Visit: Payer: Medicare Other | Admitting: Cardiology

## 2022-09-23 ENCOUNTER — Other Ambulatory Visit: Payer: Self-pay | Admitting: Cardiology

## 2022-09-23 DIAGNOSIS — G250 Essential tremor: Secondary | ICD-10-CM

## 2022-09-29 ENCOUNTER — Ambulatory Visit: Payer: Medicare Other | Admitting: Cardiology

## 2022-10-09 ENCOUNTER — Ambulatory Visit: Payer: Medicare Other | Admitting: Cardiology

## 2022-10-09 ENCOUNTER — Encounter: Payer: Self-pay | Admitting: Cardiology

## 2022-10-09 VITALS — BP 127/74 | HR 80 | Resp 16 | Ht 72.0 in | Wt 177.0 lb

## 2022-10-09 DIAGNOSIS — E782 Mixed hyperlipidemia: Secondary | ICD-10-CM

## 2022-10-09 DIAGNOSIS — I10 Essential (primary) hypertension: Secondary | ICD-10-CM

## 2022-10-09 DIAGNOSIS — G250 Essential tremor: Secondary | ICD-10-CM

## 2022-10-09 NOTE — Progress Notes (Signed)
Primary Physician/Referring:  Danella Penton, MD  Patient ID: Jesus Hobbs, male    DOB: 30-May-1950, 72 y.o.   MRN: [id number]  Chief Complaint  Patient presents with   Primary hypertension   Hyperlipidemia   Follow-up    1 year   HPI:    Jesus Hobbs  is a 72 y.o. Caucasian male patient with  hypertension and hyperlipidemia. Last stress test was in June 2012 was normal with 12.5 METs. Echocardiogram in 2012 was normal with mild LVH and mild left atrial enlargement.  This is his annual visit. He is presently tolerating Livalo without any side effects, previously did not tolerate Crestor or Lipitor. He is essentially asymptomatic.     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  Gastrointestinal:  Negative for melena.  Neurological:  Positive for tremors.   Objective      10/09/2022    1:00 PM 09/03/2021    2:58 PM 02/14/2021    8:32 AM  Vitals with BMI  Height 6\' 0"  5\' 0"  5\' 0"   Weight 177 lbs 179 lbs 10 oz 183 lbs  BMI 24 35.08 35.74  Systolic 127 117 875  Diastolic 74 71 76  Pulse 80 64 59    Physical Exam Neck:     Vascular: No carotid bruit or JVD.  Cardiovascular:     Rate and Rhythm: Normal rate and regular rhythm.     Pulses: Intact distal pulses.     Heart sounds: Normal heart sounds. No murmur heard.    No gallop.  Pulmonary:     Effort: Pulmonary effort is normal.     Breath  sounds: Normal breath sounds.  Abdominal:     General: Bowel sounds are normal.     Palpations: Abdomen is soft.  Musculoskeletal:     Right lower leg: No edema.     Left lower leg: No edema.    Laboratory examination:   Labs 10/09/2021:  Hb 15.4/HCT 45.9, platelets 195, normal indicis.  Serum glucose: 32 mg, BUN 12, creatinine 0.89, EGFR 90 mL, potassium 4.3, LFTs normal.  A1c 6.4%.  Labs 10/15/2020:  TSH normal.  Vitamin D normal at 36.9.  Lipid panel 07/19/2020:  Total cholesterol 159, triglycerides 150, HDL 44, LDL 89.  Medications and allergies   Allergies  Allergen Reactions   Loratadine-Pseudoephedrine Er     REACTION: unspecified   Mometasone Furoate     REACTION: unspecified   Prednisone     REACTION: unspecified   Tall Ragweed      Current Outpatient Medications:    APPLE CIDER VINEGAR PO, Take by mouth., Disp: , Rfl:    Cholecalciferol 25 MCG (1000 UT) capsule, Take 5 capsules by mouth daily., Disp: , Rfl:    Cyanocobalamin 1500 MCG TBDP, Take 1,500 mcg by mouth daily. sublingual, Disp: , Rfl:  LIVALO 2 MG TABS, Take 1 tablet (2 mg total) by mouth daily., Disp: 90 tablet, Rfl: 3   olmesartan (BENICAR) 20 MG tablet, Take 1 tablet by mouth at bedtime., Disp: , Rfl:    propranolol (INDERAL) 10 MG tablet, TAKE 1 TABLET BY MOUTH EVERY DAY AS NEEDED FOR TREMORS, Disp: 90 tablet, Rfl: 0   tadalafil (CIALIS) 10 MG tablet, Take 5 mg by mouth daily as needed., Disp: , Rfl:    zinc gluconate 50 MG tablet, Take 50 mg by mouth daily., Disp: , Rfl:     Radiology:  No results found. Cardiac Studies:   Carotid duplex 11/04/10: Normal carotid duplex. Normal intimal medial thickness  Echo 11/04/10: Normal LVEF. Mild LVH. Mild left atrial enlargement.    Stress EKG 07/08/10 Normal Stress EKG. 12.5 METS.    EKG:  EKG 10/09/2022: Normal sinus rhythm at the rate of 68 bpm, normal EKG.  No change from 09/03/2021.  Assessment     ICD-10-CM   1. Primary hypertension  I10  EKG 12-Lead    2. Mixed hyperlipidemia  E78.2     3. Benign essential tremor  G25.0       No orders of the defined types were placed in this encounter.  Medications Discontinued During This Encounter  Medication Reason   docusate sodium (COLACE) 100 MG capsule    aspirin EC 81 MG tablet      Recommendations:   Jesus Hobbs  is a 72 y.o. Caucasian male patient with benign essential tremors and takes propranolol on a as needed basis, hypertension and hyperlipidemia. Last stress test was in June 2012 was normal with 12.5 METs. Echocardiogram in 2012 was normal with mild LVH and mild left atrial enlargement.  This is his annual visit. He is presently tolerating Livalo without any side effects, previously did not tolerate Crestor or Lipitor. He is essentially asymptomatic. I suspect his lipids will much improved especially triglycerides.  I reviewed his labs from June 2022 and he is scheduled for repeat labs with his PCP in a month or 2, CBC and BMP are normal.   Blood pressure is also well controlled on olmesartan and he also takes propranolol low-dose for essential tremor, advised him that if his tremors do get worse, he could certainly increase the dose of beta-blockers and even discontinue or skip the dose of olmesartan as he is on low-dose.    I did not make any changes to his medications.  I congratulated him on having made lifestyle changes, last year he lost about 20 pounds in weight and has maintained weight loss.  I will see him back on a as needed basis.   Yates Decamp, MD, [facility] 10/09/2022, 1:24 PM Office: [phone] Pager: [phone]

## 2022-10-24 IMAGING — XA DG HIP (WITH PELVIS) OPERATIVE*L*
4 series · 15 of 16 positions shown · non-contrast
Comparison: None.

CLINICAL DATA: Status post left hip replacement.

EXAM:
OPERATIVE left HIP (WITH PELVIS IF PERFORMED) 4 VIEWS
TECHNIQUE: Fluoroscopic spot image(s) were submitted for interpretation
post-operatively.
Radiation exposure index: 3.1 mGy.

[Series 1: ortho standard · 4 of 11 frames shown (1 of 4)]
[frame 1/11]
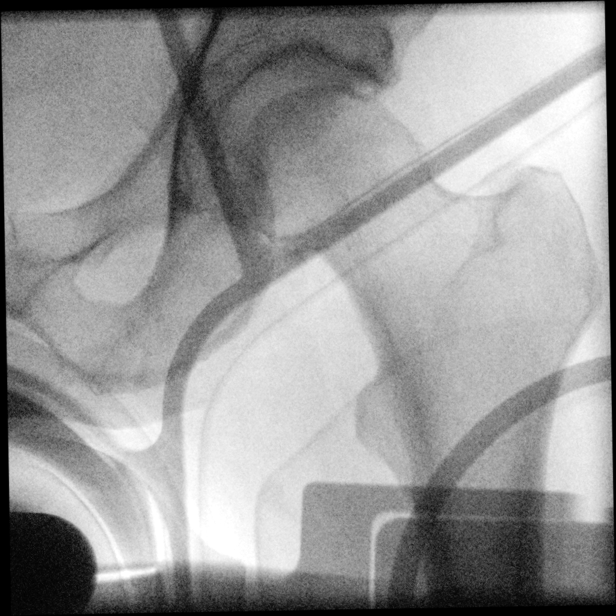
[frame 2/11]
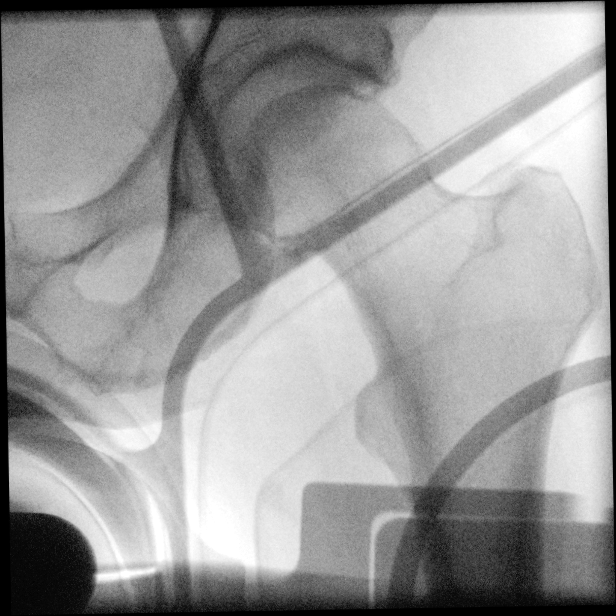
[frame 6/11]
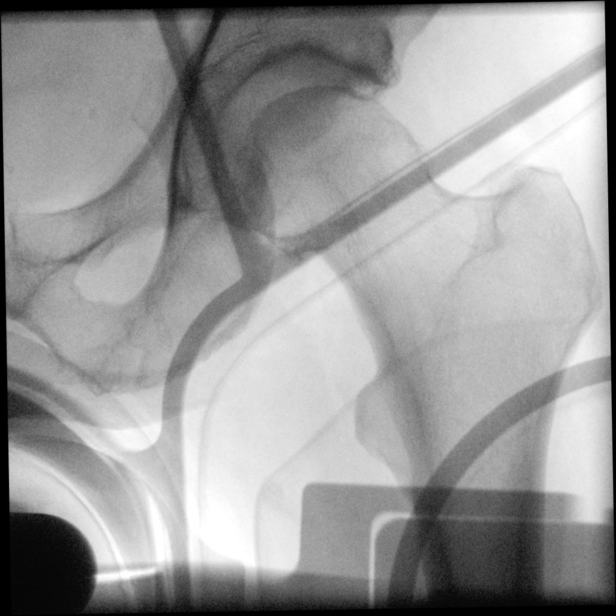
[frame 10/11]
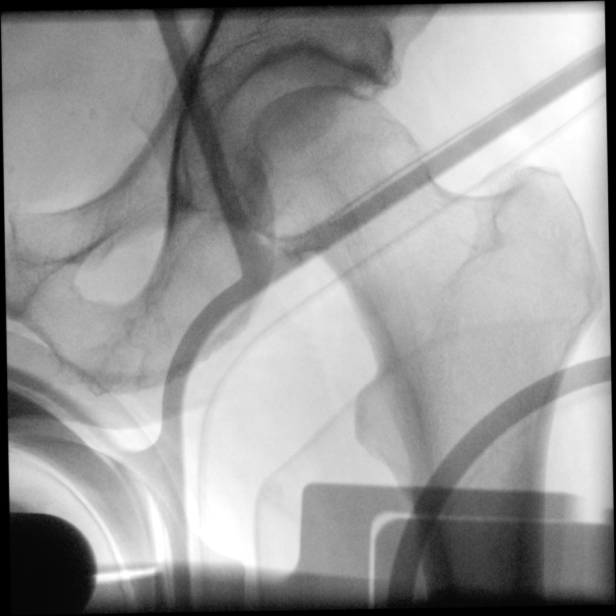

[Series 2: ortho standard · 3 of 9 frames shown (2 of 4)]
[frame 1/9]
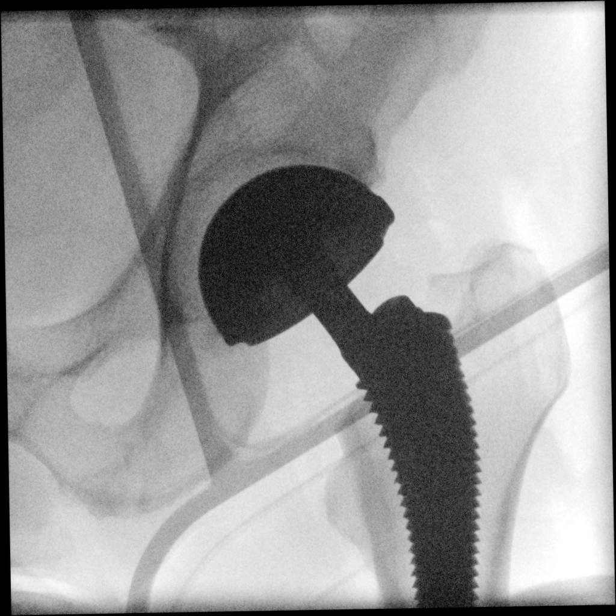
[frame 2/9]
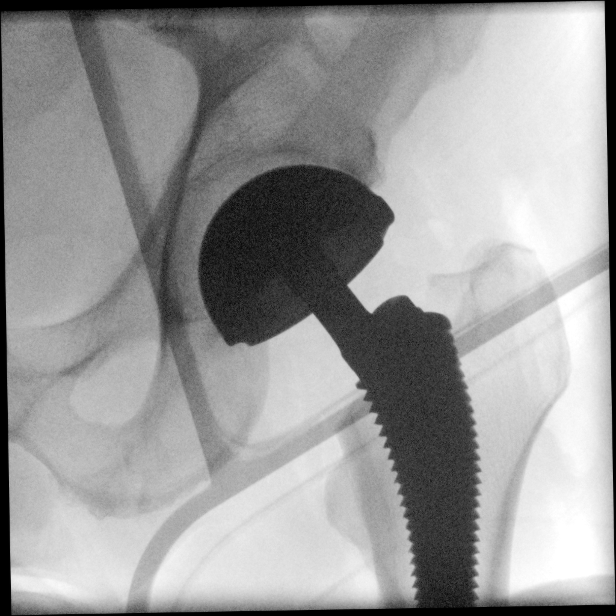
[frame 5/9]
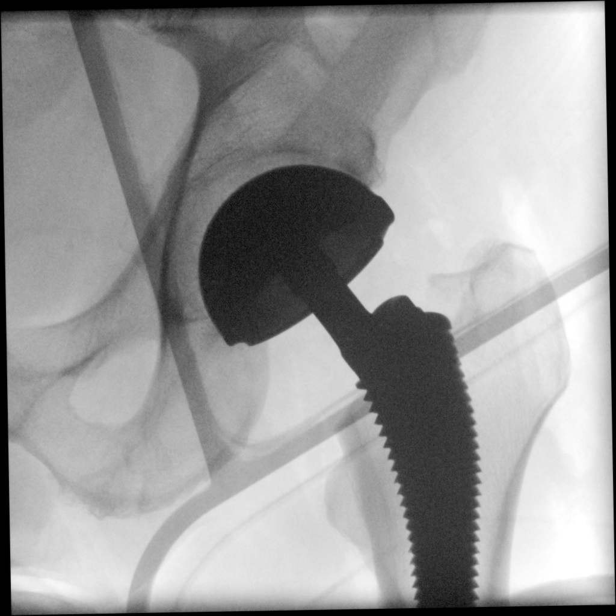

[Series 3: ortho standard · 4 of 8 frames shown (3 of 4)]
[frame 1/8]
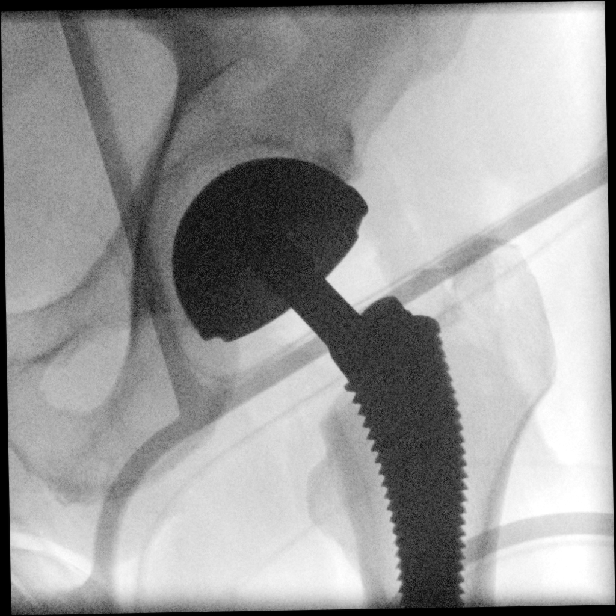
[frame 2/8]
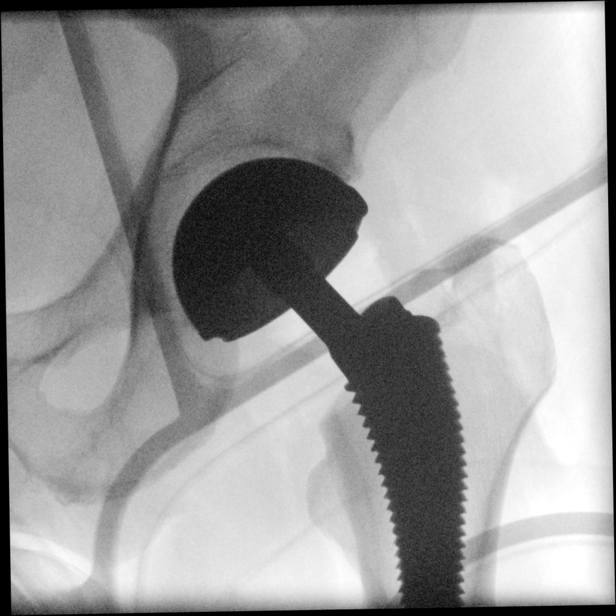
[frame 5/8]
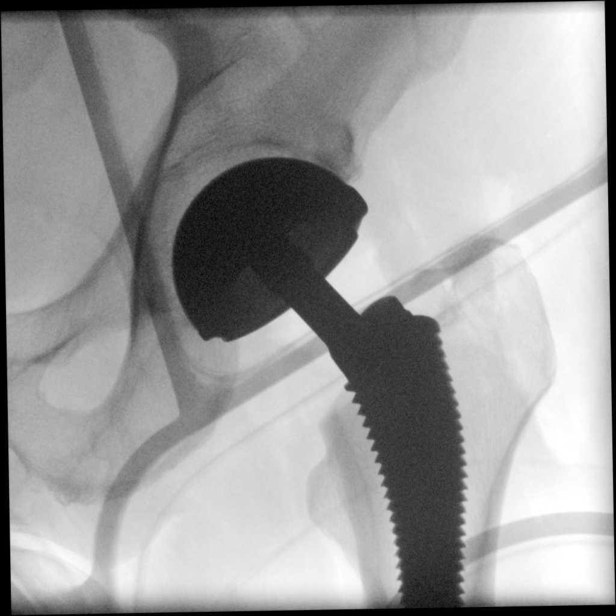
[frame 7/8]
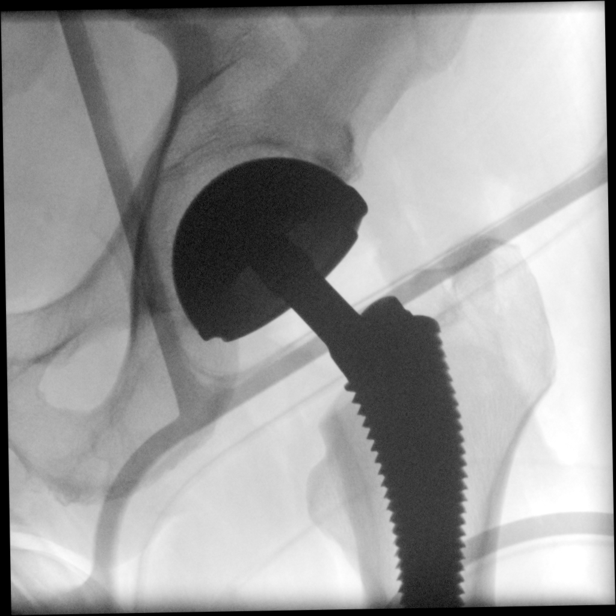

[Series 4: ortho standard · 4 of 9 frames shown (4 of 4)]
[frame 1/9]
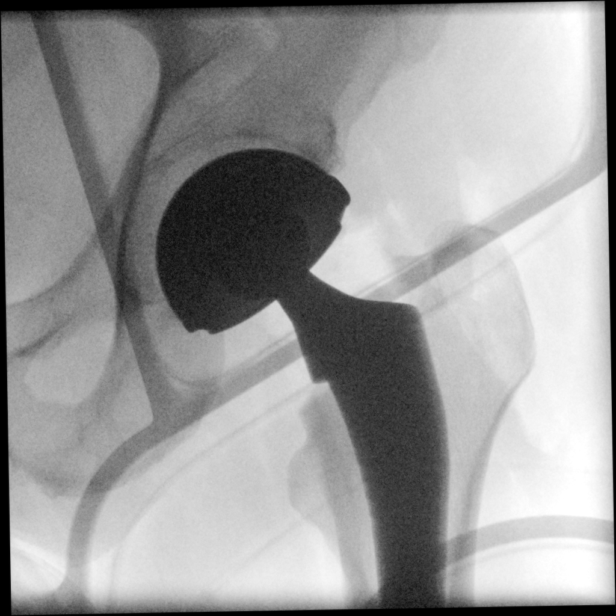
[frame 2/9]
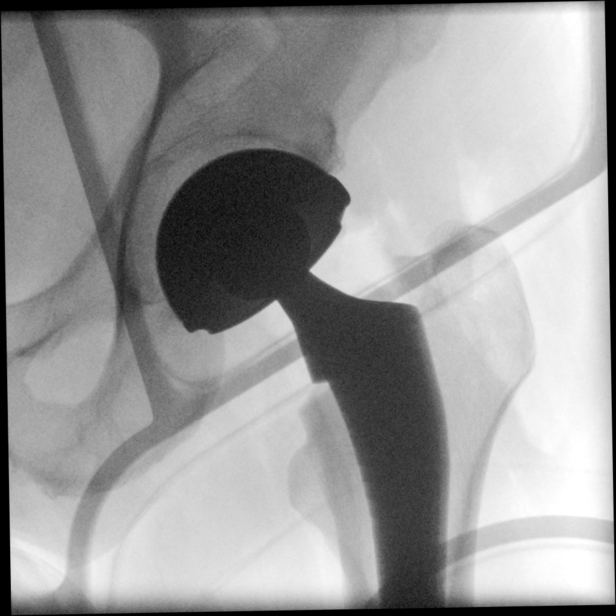
[frame 5/9]
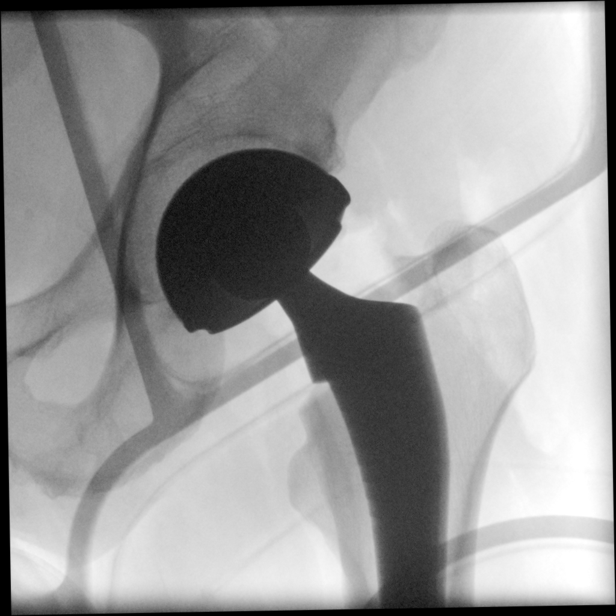
[frame 8/9]
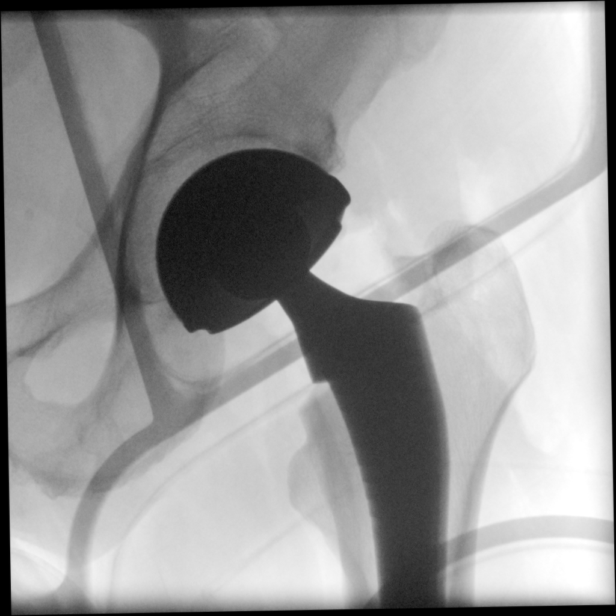

[15 of 16 positions shown; findings below may reference images not displayed]

FINDINGS: Four intraoperative fluoroscopic images were obtained of the left
hip. The left femoral and acetabular components are well situated.
IMPRESSION: Fluoroscopic guidance provided during left total hip arthroplasty.

## 2022-10-24 IMAGING — DX DG HIP (WITH OR WITHOUT PELVIS) 2-3V*L*
2 series · 2 of 2 positions shown · non-contrast
Comparison: None.

CLINICAL DATA: Left hip arthroplasty

EXAM:
DG HIP (WITH OR WITHOUT PELVIS) 2-3V LEFT

[hip ap]
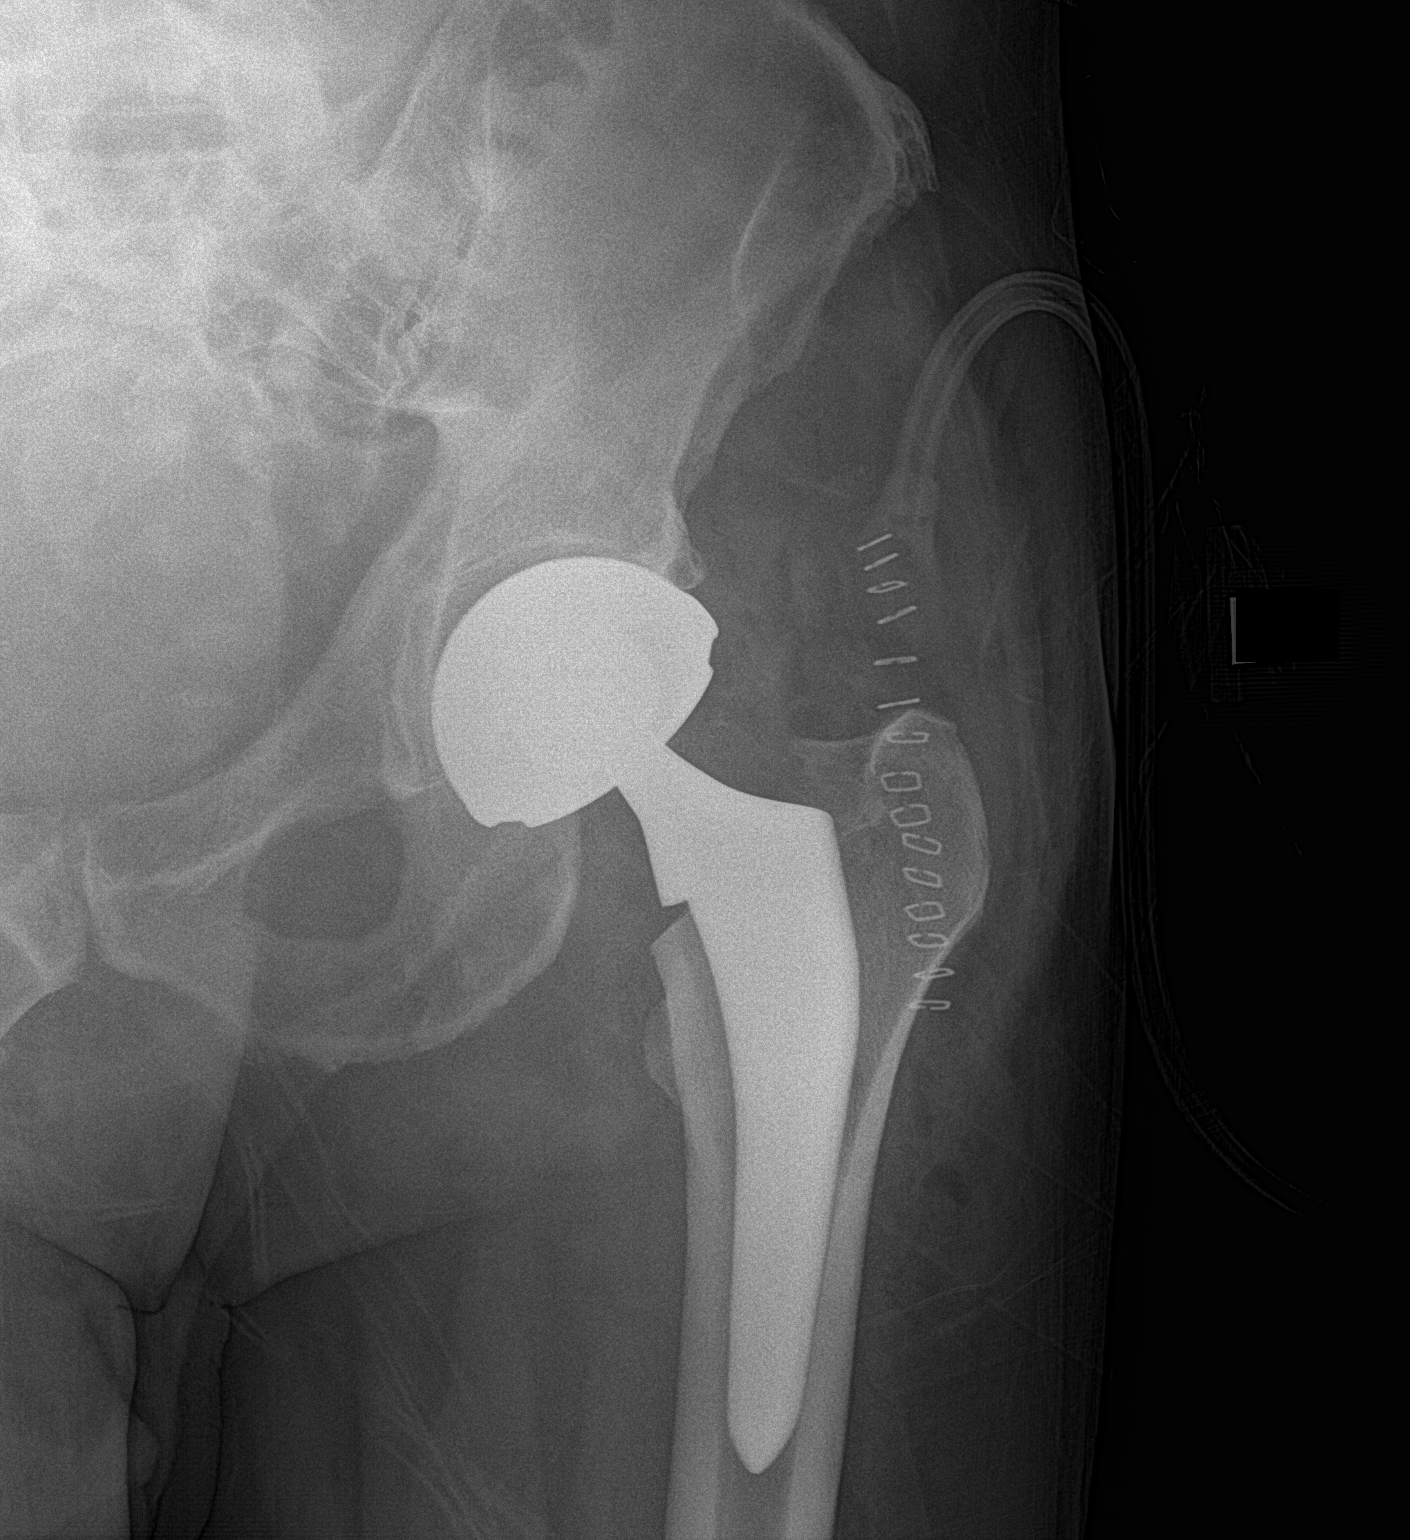

[hip lat]
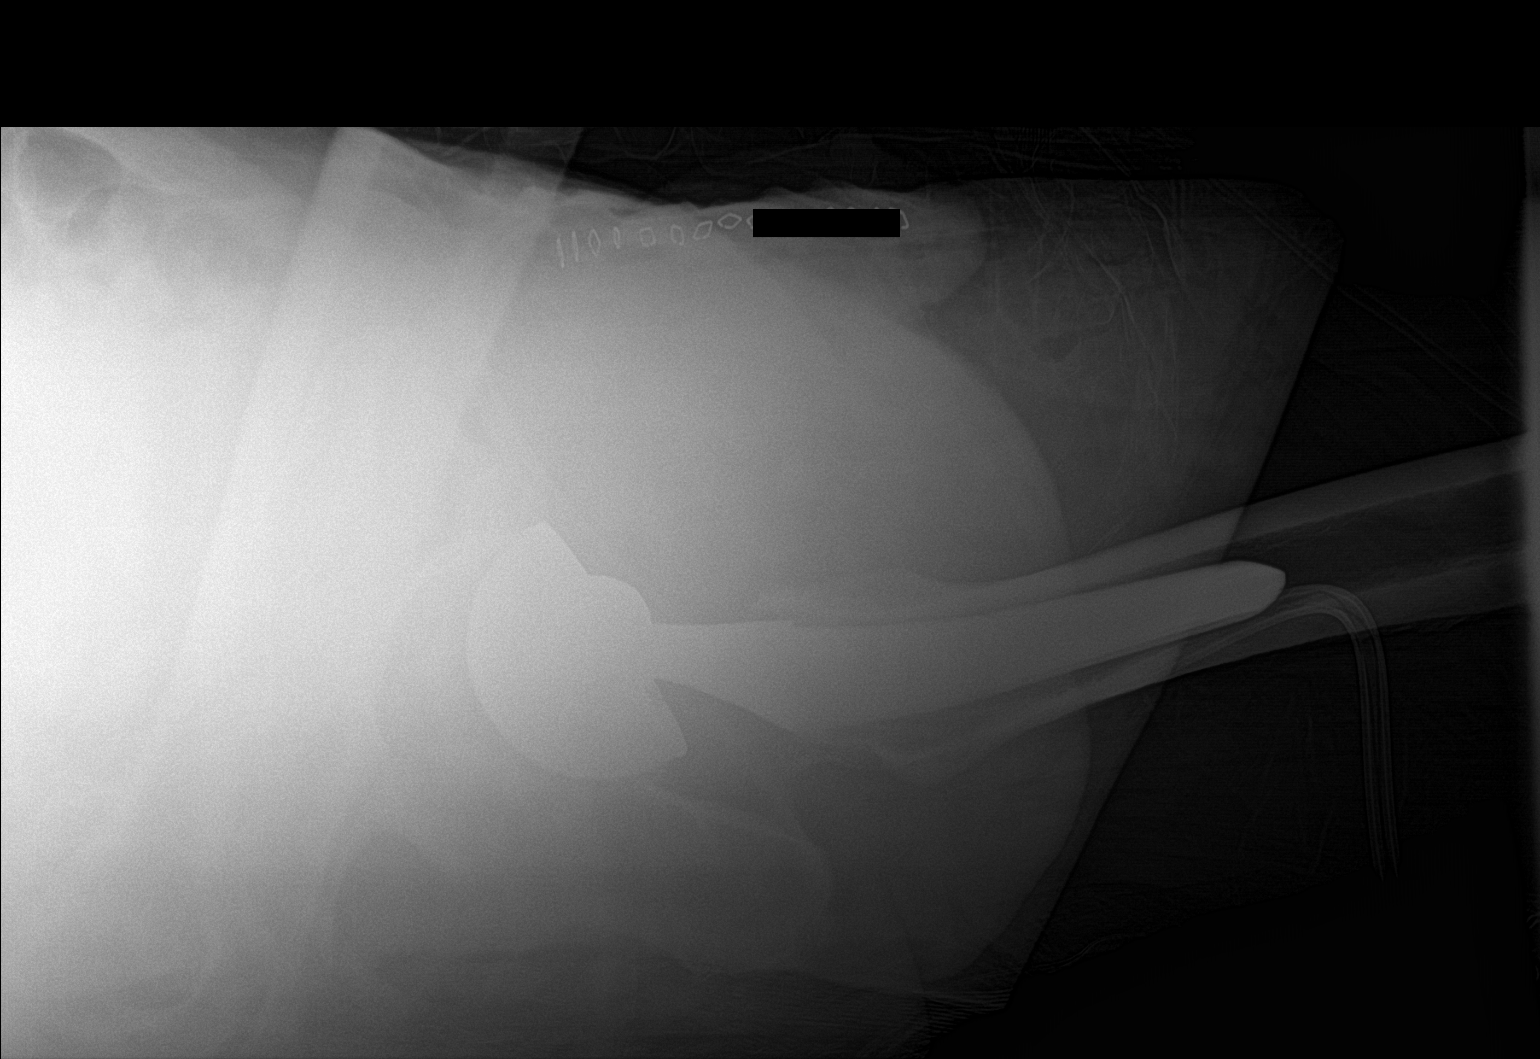

[2 of 2 positions shown; findings below may reference images not displayed]

FINDINGS: Postsurgical changes from left total hip arthroplasty. Arthroplasty
components are in their expected alignment. No periprosthetic
fracture or evidence of other complication. Expected postoperative
changes within the overlying soft tissues.
IMPRESSION: Satisfactory postoperative appearance status post left total hip
arthroplasty.
